# Patient Record
Sex: Female | Born: 1950 | Race: White | Hispanic: No
Health system: Midwestern US, Community
[De-identification: ages and names within clinical notes are randomized; demographics above are authoritative.]

## PROBLEM LIST (undated history)

## (undated) DIAGNOSIS — Z1231 Encounter for screening mammogram for malignant neoplasm of breast: Secondary | ICD-10-CM

## (undated) DIAGNOSIS — M109 Gout, unspecified: Secondary | ICD-10-CM

## (undated) DIAGNOSIS — I10 Essential (primary) hypertension: Secondary | ICD-10-CM

## (undated) DIAGNOSIS — G8929 Other chronic pain: Secondary | ICD-10-CM

## (undated) DIAGNOSIS — M545 Low back pain, unspecified: Principal | ICD-10-CM

## (undated) DIAGNOSIS — Z87891 Personal history of nicotine dependence: Secondary | ICD-10-CM

## (undated) DIAGNOSIS — F331 Major depressive disorder, recurrent, moderate: Principal | ICD-10-CM

## (undated) DIAGNOSIS — R2243 Localized swelling, mass and lump, lower limb, bilateral: Secondary | ICD-10-CM

## (undated) DIAGNOSIS — R52 Pain, unspecified: Secondary | ICD-10-CM

## (undated) DIAGNOSIS — J449 Chronic obstructive pulmonary disease, unspecified: Secondary | ICD-10-CM

## (undated) DIAGNOSIS — E559 Vitamin D deficiency, unspecified: Principal | ICD-10-CM

## (undated) DIAGNOSIS — Z9109 Other allergy status, other than to drugs and biological substances: Principal | ICD-10-CM

## (undated) DIAGNOSIS — M810 Age-related osteoporosis without current pathological fracture: Secondary | ICD-10-CM

## (undated) DIAGNOSIS — R2240 Localized swelling, mass and lump, unspecified lower limb: Secondary | ICD-10-CM

---

## 2011-03-04 LAB — CBC WITH AUTO DIFFERENTIAL
Basophils %: 0.8 % (ref 0–1)
Basophils Absolute: 0 10*3/uL (ref 0.0–0.1)
Eosinophils %: 2 % (ref 0–7)
Eosinophils Absolute: 0.1 10*3/uL (ref 0.0–0.6)
Hematocrit: 41.7 % (ref 37.0–47.0)
Hemoglobin: 14 GM/DL (ref 12.0–16.0)
Lymphocytes %: 29.6 % (ref 16–42)
Lymphocytes Absolute: 1.6 10*3/uL (ref 0.6–4.3)
MCH: 29.7 PG (ref 27–31)
MCHC: 33.6 % (ref 32.0–36.0)
MCV: 88.3 FL (ref 80–100)
MPV: 7.8 FL (ref 7.5–11.1)
Monocytes %: 6.1 % (ref 4–12)
Monocytes Absolute: 0.3 10*3/uL (ref 0.2–1.1)
Platelets: 214 10*3/uL (ref 150–400)
RBC: 4.72 10*6/uL (ref 4.20–5.40)
RDW: 14.6 % (ref 11.7–14.9)
Segs Absolute: 3.4 10*3/uL (ref 1.5–7.0)
Segs Relative: 61.5 % (ref 44–76)
WBC: 5.5 10*3/uL (ref 4.8–10.8)

## 2011-03-04 LAB — COMPREHENSIVE METABOLIC PANEL
ALT: 19 U/L (ref 7–35)
AST: 16 U/L (ref 8–33)
Albumin,Serum: 4.4 G/DL (ref 3.5–5.0)
Alkaline Phosphatase: 71 U/L (ref 25–100)
BUN: 18 mg/dL (ref 6–23)
Bilirubin, Total: 0.2 mg/dL — ABNORMAL LOW (ref 0.3–1.2)
CO2: 27 mMol/L (ref 20–32)
Calcium: 9.2 mg/dL (ref 8.3–10.6)
Chloride: 109 mMol/L (ref 99–109)
Creatinine: 0.7 mg/dL (ref 0.6–1.1)
GFR African American: >60 mL/min/{1.73_m2}
GFR Non-African American: >60 mL/min/{1.73_m2}
Glucose, GTT - Fasting: 90 MG/DL (ref 70–99)
Potassium: 4.6 MMOL/L (ref 3.5–5.1)
Sodium: 143 mmol/L (ref 136–145)
Total Protein: 7.1 g/dL (ref 6.4–8.3)

## 2011-03-04 LAB — SEDIMENTATION RATE, MANUAL: Sed Rate: 7 MM/HR (ref 0–30)

## 2011-03-05 LAB — CULTURE, URINE: Culture: NO GROWTH

## 2015-05-23 ENCOUNTER — Encounter: Attending: Gastroenterology | Primary: Family Medicine

## 2015-06-19 NOTE — Anesthesia Pre-Procedure Evaluation (Signed)
Department of Anesthesiology  Preprocedure Note       Name:  Sue Barber   Age:  64 y.o.  DOB:  [birth year]                                          MRN:  [id number]         Date:  06/19/2015      Surgeon:    Procedure:    Medications prior to admission:   Prior to Admission medications    Medication Sig Start Date End Date Taking? Authorizing Provider   celecoxib (CELEBREX) 200 MG capsule Take 200 mg by mouth 2 times daily.      Historical Provider, MD   gabapentin (NEURONTIN) 300 MG capsule Take 300 mg by mouth 3 times daily.      Historical Provider, MD   hydrocodone-acetaminophen (VICODIN) 5-500 MG per tablet Take 1 tablet by mouth every 6 hours as needed.      Historical Provider, MD   buPROPion (WELLBUTRIN XL) 150 MG XL tablet Take 150 mg by mouth every morning.      Historical Provider, MD   aripiprazole (ABILIFY) 2 MG tablet Take 2 mg by mouth daily.      Historical Provider, MD   UNABLE TO FIND Patient medications  lotrel 5-20 1 a day,  alerge 10mg  1 a day    Unable to find on database     Historical Provider, MD       Current medications:    Current Outpatient Prescriptions   Medication Sig Dispense Refill   ??? celecoxib (CELEBREX) 200 MG capsule Take 200 mg by mouth 2 times daily.       ??? gabapentin (NEURONTIN) 300 MG capsule Take 300 mg by mouth 3 times daily.       ??? hydrocodone-acetaminophen (VICODIN) 5-500 MG per tablet Take 1 tablet by mouth every 6 hours as needed.       ??? buPROPion (WELLBUTRIN XL) 150 MG XL tablet Take 150 mg by mouth every morning.       ??? aripiprazole (ABILIFY) 2 MG tablet Take 2 mg by mouth daily.       ??? UNABLE TO FIND Patient medications  lotrel 5-20 1 a day,  alerge 10mg  1 a day    Unable to find on database        No current facility-administered medications for this encounter.        Allergies:  No Known Allergies    Problem List:  There is no problem list on file for this patient.      Past Medical History:  No past medical history on file.    Past Surgical History:  No  past surgical history on file.    Social History:    Social History   Substance Use Topics   ??? Smoking status: Not on file   ??? Smokeless tobacco: Not on file   ??? Alcohol use Not on file                                Counseling given: Not Answered      Vital Signs (Current): There were no vitals filed for this visit.  BP Readings from Last 3 Encounters:   No data found for BP       NPO Status:                                                                                 BMI:   Wt Readings from Last 3 Encounters:   No data found for Wt     There is no height or weight on file to calculate BMI.    Anesthesia Evaluation  Patient summary reviewed  Airway:         Dental:          Pulmonary:negative ROS          Cardiovascular:                Beta Blocker:  Not on Beta Blocker   Neuro/Psych:   {neg ROS     GI/Hepatic/Renal:           Endo/Other:          Abdominal:                    Anesthesia Plan    ASA 2     MAC   ( Pre Anesthesia Assessment complete. Chart reviewed on 06/19/2015)            Konrad Felix, CRNA   06/19/2015

## 2015-06-20 ENCOUNTER — Encounter: Attending: Gastroenterology | Primary: Family Medicine

## 2015-06-20 LAB — CBC WITH AUTO DIFFERENTIAL
Basophils %: 0.8 % (ref 0–1)
Basophils Absolute: 0.1 10*3/uL
Eosinophils %: 1.5 % (ref 0–3)
Eosinophils Absolute: 0.1 10*3/uL
Hematocrit: 47.7 % — ABNORMAL HIGH (ref 37–47)
Hemoglobin: 15.8 GM/DL (ref 12.5–16.0)
Lymphocytes %: 20.8 % — ABNORMAL LOW (ref 24–44)
Lymphocytes Absolute: 1.4 10*3/uL
MCH: 28.7 PG (ref 27–31)
MCHC: 33.1 % (ref 32.0–36.0)
MCV: 86.7 FL (ref 78–100)
MPV: 8.4 FL (ref 7.5–11.1)
Monocytes %: 6.5 % — ABNORMAL HIGH (ref 0–4)
Monocytes Absolute: 0.4 10*3/uL
Platelets: 207 10*3/uL (ref 140–440)
RBC: 5.5 10*6/uL — ABNORMAL HIGH (ref 4.2–5.4)
RDW: 14.4 % (ref 11.7–14.9)
Segs Absolute: 4.5 10*3/uL
Segs Relative: 70.4 % — ABNORMAL HIGH (ref 36–66)
WBC: 6.5 10*3/uL (ref 4.0–10.5)

## 2015-06-20 LAB — BASIC METABOLIC PANEL
Anion Gap: 11 (ref 4–16)
BUN: 11 MG/DL (ref 6–23)
CO2: 27 MMOL/L (ref 21–32)
Calcium: 9.1 MG/DL (ref 8.3–10.6)
Chloride: 103 mMol/L (ref 99–110)
Creatinine: 0.9 MG/DL (ref 0.6–1.1)
GFR African American: >60 mL/min/{1.73_m2}
GFR Non-African American: >60 mL/min/{1.73_m2}
Glucose: 96 MG/DL (ref 70–140)
Potassium: 4.1 MMOL/L (ref 3.5–5.1)
Sodium: 141 MMOL/L (ref 135–145)

## 2015-06-20 MED ORDER — PROPOFOL 200 MG/20ML IV EMUL
200 MG/20ML | INTRAVENOUS | Status: AC
Start: 2015-06-20 — End: ?

## 2015-06-20 MED ORDER — LACTATED RINGERS IV SOLN
INTRAVENOUS | Status: AC
Start: 2015-06-20 — End: 2015-06-20
  Administered 2015-06-20: 12:00:00 via INTRAVENOUS

## 2015-06-20 MED ORDER — LACTATED RINGERS IV SOLN
INTRAVENOUS | Status: DC
Start: 2015-06-20 — End: 2015-06-20

## 2015-06-20 MED ORDER — ONDANSETRON HCL 4 MG/2ML IJ SOLN
4 MG/2ML | INTRAMUSCULAR | Status: AC
Start: 2015-06-20 — End: ?

## 2015-06-20 MED FILL — PROPOFOL 200 MG/20ML IV EMUL: 200 MG/20ML | INTRAVENOUS | Qty: 20

## 2015-06-20 MED FILL — ONDANSETRON HCL 4 MG/2ML IJ SOLN: 4 MG/2ML | INTRAMUSCULAR | Qty: 2

## 2015-06-20 MED FILL — LACTATED RINGERS IV SOLN: INTRAVENOUS | Qty: 1000

## 2015-06-20 NOTE — H&P (Signed)
[facility]   Pre-operative History and Physical    Patient: Sue Barber  DOB: 07-28-1951      History Obtained From:  patient        HISTORY OF PRESENT ILLNESS:                The patient is a 64 y.o. female with significant past medical history as below who presents for C scope    Indication Colon cancer screening    Past Medical History:        Diagnosis Date   ??? Arthritis      "Back, Knees"   ??? Depression    ??? Diverticulosis    ??? Hyperlipidemia      "Borderline"   ??? Hypertension    ??? Restless legs    ??? Seizures (HCC)      "Had Febrile Seizures Until I Was One Year Old"   ??? Shortness of breath on exertion    ??? Sleep apnea      Uses CPAP   ??? Wears glasses        Past Surgical History:        Procedure Laterality Date   ??? Colonoscopy  2001     "Found 2 Polyps"   ??? Tubal ligation  1978   ??? Cholecystectomy  1999     Appendectomy   ??? Appendectomy  1999     Deone During Cholecystectomy   ??? 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 Joint replacement Left 02/2010     Total Left Knee         Current Medications:    Medications    Scheduled Medications:    PRN Medications:       Allergies:  Other    Social History:   TOBACCO:   reports that she has been smoking.  She started smoking about 52 years ago. She has a 13.00 pack-year smoking history. She has never used smokeless tobacco.  ETOH:   reports that she drinks alcohol.    Family History:       Problem Relation Age of Onset   ??? Heart Disease Mother    ??? High Blood Pressure Mother    ??? Stroke Mother    ??? Heart Disease Father    ??? High Blood Pressure Father    ??? Other Sister      "Iron Deficiency"   ??? Other Brother      Iron Deficiency   ??? Diabetes Brother    ??? Heart Disease Brother      "Bypass Surgery"   ??? Arthritis Sister    ??? Psoriasis Sister    ??? Other Brother      "Hepatitis C"   ??? Other Son      Sleep Apnea   ??? Mental Illness Son      PTSD   ??? Psoriasis Son        REVIEW OF SYSTEMS:    Negative except for HPI        PHYSICAL EXAM:       Vitals:    Visit Vitals   ??? Pulse 81   ??? Temp 97.8 ??F (36.6 ??C) (Temporal)   ??? Resp 16   ??? Ht 4' 9.5" (1.461 m)   ??? Wt 270 lb (122.5 kg)   ??? SpO2 95%   ??? Breastfeeding No   ??? BMI 57.42 kg/m2       General Appearance:  Alert, cooperative, no distress, appears stated age   HEENT:    NO anemia, No jaundice , no Cyanosis, Supple neck   Neck:   Supple, symmetrical, trachea midline, no adenopathy;     thyroid:    Lungs:     Clear to auscultation bilaterally, respirations unlabored   Chest Wall:    No tenderness or deformity    Heart:    Regular rate and rhythm, S1 and S2 normal, no murmur, rub   or gallop   Abdomen:     Soft, non-tender, bowel sounds active all four quadrants,     no masses, no organomegaly, no ascitis   Rectal:    Planned at time of C scope   Extremities:   Extremities normal, atraumatic, no cyanosis or edema   Pulses:   2+ and symmetric all extremities   Skin:   Skin color, texture, turgor normal, no rashes or lesions   Lymph nodes:   No abnormality   Neurologic:   No focal deficits, moving all four extremities      ASA Grade:  ASA 2 - Patient with mild systemic disease with no functional limitations  Air Way:    Prior Anesthesia complication: NILL    ASSESSMENT AND PLAN:    1.  Patient is a 64 y.o. female here for colonoscopy with deep sedation  2.  Procedure options, risks and benefits reviewed with patient.  Patient expresses understanding.    Tilak Oakley  06/20/2015  9:10 AM

## 2015-06-20 NOTE — Anesthesia Pre-Procedure Evaluation (Signed)
Department of Anesthesiology  Preprocedure Note       Name:  Sue Barber   Age:  64 y.o.  DOB:  [birth year]                                          MRN:  [id number]         Date:  06/20/2015      Surgeon:    Procedure:colonoscopy    Medications prior to admission:   Prior to Admission medications    Medication Sig Start Date End Date Taking? Authorizing Provider   buPROPion (WELLBUTRIN XL) 300 MG XL tablet Take 300 mg by mouth every morning   Yes Historical Provider, MD   amLODIPine-benazepril (LOTREL) 5-20 MG per capsule Take 1 capsule by mouth every morning   Yes Historical Provider, MD   loratadine (CLARITIN) 10 MG tablet Take 10 mg by mouth every morning Over The Counter   Yes Historical Provider, MD   buPROPion (WELLBUTRIN XL) 150 MG XL tablet Take 150 mg by mouth every morning.     Yes Historical Provider, MD   celecoxib (CELEBREX) 200 MG capsule Take 200 mg by mouth 2 times daily.      Historical Provider, MD       Current medications:    Current Facility-Administered Medications   Medication Dose Route Frequency Provider Last Rate Last Dose   ??? lactated ringers infusion   Intravenous Continuous Charisse Klinefelter, DO 100 mL/hr at 06/20/15 0820         Allergies:    Allergies   Allergen Reactions   ??? Other Rash     "Allergic To Certain Scents In Body Lotions Causing Rash"       Problem List:  There is no problem list on file for this patient.      Past Medical History:        Diagnosis Date   ??? Arthritis      "Back, Knees"   ??? Depression    ??? Diverticulosis    ??? Hyperlipidemia      "Borderline"   ??? Hypertension    ??? Restless legs    ??? Seizures (HCC)      "Had Febrile Seizures Until I Was One Year Old"   ??? Shortness of breath on exertion    ??? Sleep apnea      Uses CPAP   ??? Wears glasses        Past Surgical History:        Procedure Laterality Date   ??? Colonoscopy  2001     "Found 2 Polyps"   ??? Tubal ligation  1978   ??? Cholecystectomy  1999     Appendectomy   ??? Appendectomy  1999     Deone During  Cholecystectomy   ??? 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 Joint replacement Left 02/2010     Total Left Knee       Social History:    Social History   Substance Use Topics   ??? Smoking status: Current Every Day Smoker     Packs/day: 0.25     Years: 52.00     Start date: 06/19/1963   ??? Smokeless tobacco: Never Used   ??? Alcohol use Yes      Comment: "Occ. Maybe Once A Month"  Ready to quit: Yes  Counseling given: No      Vital Signs (Current):   Vitals:    06/19/15 1216 06/20/15 0808   Pulse:  81   Resp:  16   Temp:  97.8 ??F (36.6 ??C)   TempSrc:  Temporal   SpO2:  95%   Weight: 270 lb (122.5 kg)    Height: 4' 9.5" (1.461 m)                                               BP Readings from Last 3 Encounters:   No data found for BP       NPO Status: Time of last liquid consumption: 0145                        Time of last solid consumption: 2000                        Date of last liquid consumption: 06/20/15                        Date of last solid food consumption: 06/18/15    BMI:   Wt Readings from Last 3 Encounters:   06/19/15 270 lb (122.5 kg)     Body mass index is 57.42 kg/(m^2).    Anesthesia Evaluation  Patient summary reviewed no history of anesthetic complications:   Airway: Mallampati: I  TM distance: >3 FB   Neck ROM: full  Mouth opening: > = 3 FB Dental: normal exam         Pulmonary:negative ROS and normal exam    (+) sleep apnea: on CPAP,         Cardiovascular:  Exercise tolerance: poor (<4 METS),   (+) hypertension:, DOE: after ambulating 1 flight of stairs,         Rhythm: regular  Rate: normal              Beta Blocker:  Not on Beta Blocker   Neuro/Psych:   (+) seizures: well controlled,       Comments: 60 years ago  Restless legs GI/Hepatic/Renal:   (+) bowel prep        Endo/Other: negative ROS         Abdominal:   (+) obese,                  Anesthesia Plan    ASA 3     general and TIVA   ( Pre Anesthesia Assessment complete. Chart reviewed on  06/19/2015)  intravenous induction   Anesthetic plan and risks discussed with patient.    Pre Anesthesia Evaluation complete. Anesthesia plan, risks, benefits, alternatives, and personnel discussed with patient and/or legal guardian. Patient and/or legal guardian verbalized an understanding and agreed to proceed. Anesthesia plan discussed with care team members and agreed upon.  Fayne Norrie, CRNA  06/20/2015        Fayne Norrie, CRNA   06/20/2015

## 2015-06-20 NOTE — Discharge Instructions (Signed)
[facility]  [phone]    Do not drive, work around machines or use equipment.  Do not drink any alcoholic beverages.  Do not smoke while alone.  Avoid making important decisions.  Plan to spend a quiet, relaxed evening @ home.  Resume normal activities as you begin to feel better.  Eat lightly for your first meal, then gradually increase your diet to what is normal for you.  In case of nausea, avoid food and drink only clear liquids.  Resume food as nausea ceases.  Notify your surgeon if you experience fever, chills, large amount of bleeding, difficulty breathing, persistent nausea and vomiting or any other disturbing problem.  Call for a follow-up appointment with your surgeon.

## 2015-06-20 NOTE — Progress Notes (Signed)
I have examined the patient within 24 hours before the procedure and there is no change in the history and physical exam  recorded by me previously.    I have reviewed with the patient and/or family the risks, benefits, and alternatives to the procedure.    Rosezetta Schlatter, MD  [facility] gastroenterology  06/20/2015  8:47 AM

## 2015-06-20 NOTE — Progress Notes (Addendum)
0955 Pt given coffee and water. Pt denies c/o. Call light in reach. 1045 Pt up to restroom with assist. Pt voided without difficulty and tolerated well. Pt back to room. IV removed and pt ready to get dressed to go home to go home. Sister at bedside. Call light in reach.

## 2015-06-20 NOTE — Anesthesia Post-Procedure Evaluation (Signed)
Anesthesia Post-op Note    Patient: Sue Barber  MRN: [id number]  Birthdate: Sep 07, 1951  Date of evaluation: 06/20/2015  Time:  9:49 AM     Procedure(s) Performed: colonoscopy    Last Vitals:   Visit Vitals   ??? Pulse 81   ??? Temp 97.8 ??F (36.6 ??C) (Temporal)   ??? Resp 16   ??? Ht 4' 9.5" (1.461 m)   ??? Wt 270 lb (122.5 kg)   ??? SpO2 95%   ??? Breastfeeding No   ??? BMI 57.42 kg/m2       Aldrete Phase I:   10    Aldrete Phase II:  10    Anesthesia Post Evaluation    Final anesthesia type: general and TIVA  Patient location during evaluation: procedure area  Patient participation: complete - patient participated  Level of consciousness: awake and alert  Pain score: 0  Airway patency: patent  Nausea & Vomiting: no nausea and no vomiting  Complications: no  Cardiovascular status: hemodynamically stable  Respiratory status: acceptable, spontaneous ventilation, room air and nonlabored ventilation  Hydration status: euvolemic        Fayne Norrie, CRNA  9:49 AM

## 2015-06-20 NOTE — Other (Signed)
Patient Acct Nbr:  [id number]  Primary AUTH/CERT:    Primary Insurance Company Name:   [employer]  Primary Insurance Plan Name:  Rachael Fee ACCESS/FEDERAL(R)  Primary Insurance Group Number:  [id number]  Primary Insurance Plan Type: B  Primary Insurance Policy Number:  [id number]

## 2015-11-19 ENCOUNTER — Encounter

## 2015-11-19 ENCOUNTER — Inpatient Hospital Stay: Admit: 2015-11-19 | Attending: Family Medicine | Primary: Family Medicine

## 2015-11-19 DIAGNOSIS — Z1231 Encounter for screening mammogram for malignant neoplasm of breast: Secondary | ICD-10-CM

## 2015-11-19 NOTE — Telephone Encounter (Signed)
lmovm to call and set up cardiolite per dr Andrey Campanilewilson

## 2015-11-19 NOTE — Telephone Encounter (Signed)
Patient called to schedule this, Sue Barber not available but patient said she would call office to schedule after her dentist appointment today.

## 2015-11-27 ENCOUNTER — Ambulatory Visit: Admit: 2015-11-27 | Discharge: 2015-11-27 | Payer: BLUE CROSS/BLUE SHIELD | Primary: Family Medicine

## 2015-11-27 DIAGNOSIS — I208 Other forms of angina pectoris: Secondary | ICD-10-CM

## 2015-11-27 NOTE — Progress Notes (Signed)
9:18 AM    11/27/15    Site: hand left, condition patent and no redness    # of attempts: 1

## 2015-11-28 NOTE — Telephone Encounter (Signed)
Left message to advise of normal NM.

## 2017-01-26 ENCOUNTER — Ambulatory Visit
Admit: 2017-01-26 | Discharge: 2017-01-26 | Payer: BLUE CROSS/BLUE SHIELD | Attending: Surgery | Primary: Family Medicine

## 2017-01-26 DIAGNOSIS — M79604 Pain in right leg: Secondary | ICD-10-CM

## 2017-01-26 MED ORDER — GABAPENTIN 100 MG PO CAPS
100 MG | ORAL_CAPSULE | Freq: Three times a day (TID) | ORAL | 0 refills | Status: DC
Start: 2017-01-26 — End: 2017-04-21

## 2017-02-09 ENCOUNTER — Ambulatory Visit
Admit: 2017-02-09 | Discharge: 2017-02-09 | Payer: BLUE CROSS/BLUE SHIELD | Attending: Surgery | Primary: Family Medicine

## 2017-02-09 ENCOUNTER — Ambulatory Visit: Admit: 2017-02-09 | Discharge: 2017-02-09 | Payer: BLUE CROSS/BLUE SHIELD | Primary: Family Medicine

## 2017-02-09 DIAGNOSIS — I83893 Varicose veins of bilateral lower extremities with other complications: Secondary | ICD-10-CM

## 2017-02-09 DIAGNOSIS — M79604 Pain in right leg: Secondary | ICD-10-CM

## 2017-02-09 NOTE — Progress Notes (Signed)
Neg DVT. See report.

## 2017-02-19 ENCOUNTER — Encounter: Attending: Surgery | Primary: Family Medicine

## 2017-03-05 ENCOUNTER — Ambulatory Visit
Admit: 2017-03-05 | Discharge: 2017-03-05 | Payer: BLUE CROSS/BLUE SHIELD | Attending: Surgery | Primary: Family Medicine

## 2017-03-05 DIAGNOSIS — I83891 Varicose veins of right lower extremities with other complications: Secondary | ICD-10-CM

## 2017-03-05 NOTE — Patient Instructions (Signed)
Richard M. Nedelman, M.D., F.A.C.S.  Jennifer M. Daniels, M.D., F.A.C.S.  Steven E. Conkel, M.D.,F.A.C.S.    [address], [address], [place] [address]    Phone [phone]  Fax [phone]  [email or web address]     Post-Op Instructions:  1.  Normal daily activity, including stairs is okay following your closure.  2.  No heavy lifting, pushing or pulling for 24 hours.  3.  You may return to work the following day.  4.  You will remove the co-band wrap and stocking 2 days after surgery. When removing the co-band wrap have legs elevated and rest for 5 minutes before walking to the shower.  Do not reapply the co-band wrap.  5.  Return to the office 3-5 days after surgery for a post-op ultrasound.  6.  For pain management, you may take Tylenol, Advil or Motrin as directed.    Normal Post-Operative Changes:  Swelling:  It is common to have swelling and some bruising around the bandage (at the ankle and thigh).  This is normal due to the compression of the bandage pushing the fluid towards the foot (similar to wearing tight shoes).  Bruising:  You may notice some bruising after the removal of your bandage.  This is normal and can occur along the track of the treated vein, ares of injections and incisions. This will improve gradually after surgery.  Numbness:  If numbness occurs, it is caused by the irritation to the nerve that runs alongside of the treated vein.  This will resolve slowly.  You should note improvement each week.  Massaging along the track of the treated vein 3-4 times a day can help with the numbness.  Pain:  Rarely, patients experience discomfort and redness along the path of the treated vein approximately one week after your procedure.  This is the inflammatory reaction within your body due to the vein closure.  Stretching 3-4 times a day can help with the discomfort.    Summary:  1.  Occasionally, you may actually be able to feel the treated vein that has been occluded.  This will  feel like a cord under your skin.  Massaging the area vigorously will help and this will improve slowly after surgery.  2.  Noticing more aching and even swelling a week after surgery is normal.  This results from removal of the compression bandage and being more active.  3.  Compression stockings may be worn at any time after removal of the dressings.  However, if the stockings irritate the incision areas, its okay to wait to wear them after the incisions are healed.  They are especially helpful when flying and/or for long times.  4.  Periodic elevation of the legs for about 5-10 minutes during the first month after your surgery will decrease any swelling and/or aching.

## 2017-03-08 ENCOUNTER — Ambulatory Visit: Admit: 2017-03-08 | Discharge: 2017-03-08 | Payer: BLUE CROSS/BLUE SHIELD | Primary: Family Medicine

## 2017-03-08 DIAGNOSIS — I83891 Varicose veins of right lower extremities with other complications: Secondary | ICD-10-CM

## 2017-03-08 NOTE — Progress Notes (Signed)
Neg DVT. See report.

## 2017-03-08 NOTE — Progress Notes (Signed)
Please refer to the media tab for today's operative note.

## 2017-03-14 NOTE — Progress Notes (Signed)
Date of Consultation:  01/26/2017    PCP:  Wilford Corner, MD    Referring Provider:  same     Chief Complaint: leg pain    History of Present Illness:   Sue Barber is a 66 y.o. female who presents with a complaint of severe leg pain. She is in tears in the office today, she can't sit till. She is pacing then sitting then pacing again. She states she has had restless leg for years. The last three weeks her leg pain has gotten more severe with sharp shooting pain right > left and leg cramps. She states she hasn't been able to work the last few days because of the pain. She works as a Haematologist and has had to sit to work over the last month but the last few days she couldn't even tolerate that. She has been taking vicodin and naprosyn the last several days without any relief. She states she can't stand in 1 place nor sit for periods of time without severe pain. She states even driving is difficult now because of the pain.      Past Medical History:  Past Medical History:   Diagnosis Date   . Arthritis     "Back, Knees"   . Depression    . Diverticulosis    . History of nuclear stress test 11/27/2015    cardiolite-normal,EF70%   . Hyperlipidemia     "Borderline"   . Hypertension    . Restless legs    . Seizures (HCC)     "Had Febrile Seizures Until I Was [age] Year Old"   . Shortness of breath on exertion    . Sleep apnea     Uses CPAP   . Wears glasses        Past Surgical History:  Past Surgical History:   Procedure Laterality Date   . APPENDECTOMY  1999    Deone During Cholecystectomy   . CHOLECYSTECTOMY  1999    Appendectomy   . COLONOSCOPY  2001    "Found 2 Polyps"   . 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   . JOINT REPLACEMENT Left 02/2010    Total Left Knee   . TUBAL LIGATION  1978       Home Medications:   Prior to Admission medications    Medication Sig Start Date End Date Taking? Authorizing Provider   HYDROcodone-acetaminophen (NORCO) 5-325 MG per tablet  01/20/17  Yes Historical Provider, MD    calcipotriene (DOVONEX) 0.005 % cream  11/27/16  Yes Historical Provider, MD   gabapentin (NEURONTIN) 100 MG capsule Take 1 capsule by mouth 3 times daily for 90 days. 01/26/17 04/26/17 Yes Ander Slade, MD   buPROPion (WELLBUTRIN XL) 300 MG XL tablet Take 300 mg by mouth every morning   Yes Historical Provider, MD   amLODIPine-benazepril (LOTREL) 5-20 MG per capsule Take 1 capsule by mouth every morning   Yes Historical Provider, MD   loratadine (CLARITIN) 10 MG tablet Take 10 mg by mouth every morning Over The Counter   Yes Historical Provider, MD   celecoxib (CELEBREX) 200 MG capsule Take 200 mg by mouth 2 times daily.     Yes Historical Provider, MD   buPROPion (WELLBUTRIN XL) 150 MG XL tablet Take 150 mg by mouth every morning.     Yes Historical Provider, MD        Allergies:  Other     Social History:  Social History   Substance Use Topics   . Smoking status: Current Every Day Smoker     Packs/day: 0.25     Years: 52.00     Start date: 06/19/1963   . Smokeless tobacco: Never Used   . Alcohol use Yes      Comment: "Occ. Maybe Once A Month"       Family History:  Mother had a CVA    Physical Examination:    BP 136/74   Ht 4' 9.5" (1.461 m)   Wt 282 lb (127.9 kg)   BMI 59.97 kg/m        General appearance: Awake, alert, oriented to person, place and time  Pulses:    PT   DP  Right:  Doppler strong Doppler strong   Left  Doppler strong Doppler strong      Right Leg:   Signs of venous disease:  present   Spider Veins:   present   Visible varicose veins:  minimal   Edema:   present, 2+   Pigmentation/Dermatitis: Present, redness below knee   Healed Ulcers:   absent   Active Ulcers:   absent    Left Leg:    Signs of venous disease:  present   Spider Veins:   present   Visible varicose veins:  minimal   Edema:   present, 2+   Pigmentation/Dermatitis: Present, redness below knee   Healed Ulcers:   absent   Active Ulcers:   absent    MEDICAL DECISION MAKING/TESTING   Duplex  Ultrasound:  bilateral   Sclerotherapy:   neither   Medical Compression Stockings: bilateral    Assessment and Plan:   Patient has signs and symptoms consistent with venous insufficiency however her severity of pain is not consistent with vein disease. I reviewed the intra-office literature about VNUS Closure and the anatomy, disease process, diagnosis, symptoms and treatments available. I specifically discussed the risks of VNUS ablation including the risk of blood clot, localized pain/swelling, failure of treatment and persistent or worsening of symptoms or discoloration of the skin. She stated an understanding and wants to proceed with ultrasound evaluation. I did discuss with her her expectations of relief. I discussed venous insufficiency can cause cramping and pain but her described pain is out of proportion for venous disease. She asked for more pain medication and I told her I am not prescribing narcotics for her pain as I cannot attribute it all to vein disease. I suggested neurontin and I explained it would need to build in her system and was not an instant fix for her pain. Again she was tearful and quite upset. I discussed possible nerve compression as the cause of her pain and why I thought neurontin may help. I suggested elevated her legs and weight loss. The patient will follow-up with me after the ultrasound and we will discuss the treatment plan.     Please refer to the patient screening form included under the media section.

## 2017-04-06 ENCOUNTER — Ambulatory Visit: Admit: 2017-04-06 | Discharge: 2017-04-06 | Payer: BLUE CROSS/BLUE SHIELD | Primary: Family Medicine

## 2017-04-06 DIAGNOSIS — I83893 Varicose veins of bilateral lower extremities with other complications: Secondary | ICD-10-CM

## 2017-04-06 NOTE — Progress Notes (Signed)
Neg DVT. See report.

## 2017-04-15 ENCOUNTER — Encounter

## 2017-04-15 ENCOUNTER — Inpatient Hospital Stay: Admit: 2017-04-15 | Attending: Family Medicine | Primary: Family Medicine

## 2017-04-15 DIAGNOSIS — Z1231 Encounter for screening mammogram for malignant neoplasm of breast: Secondary | ICD-10-CM

## 2017-04-21 ENCOUNTER — Ambulatory Visit
Admit: 2017-04-21 | Discharge: 2017-04-21 | Payer: BLUE CROSS/BLUE SHIELD | Attending: Surgery | Primary: Family Medicine

## 2017-04-21 DIAGNOSIS — Z09 Encounter for follow-up examination after completed treatment for conditions other than malignant neoplasm: Secondary | ICD-10-CM

## 2017-05-15 NOTE — Progress Notes (Signed)
I reviewed the results of the bilateral ultrasound with the patient. She has positive reflux in the right Greater saphenous vein. While her symptoms are consistant with this finding, I do believe she has a neuropathic pain as well. Today in the office she is crying. She states she stopped going to work and is disabled because of her leg pain. Patient is crying stating she has to have the procedure on the left because that leg hurts as well. We discussed possibly repeating the ultrasound at the end of the day after she has been standing but at this time the right leg shows reflux that can be treated with ablation therapy. She has worn compression stocking to alleviate symptoms as well as leg elevation and anti-inflammatory medication for these symptoms. It affects the activities of daily life, work and daily activities at home. Currently she states she cannot work because of the pain. 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  I reviewed the procedures risks and benefits including but not limited to the risk of bleeding, risk of infection, risk of failure of treatment and risk of blood clot with the patient and answered all questions. I gave the patient the [facility] procedure pamphlet. The patient wishes to proceed with treatment.

## 2017-05-16 NOTE — Progress Notes (Signed)
Overall she feels much better.  Her neurontin has been increased to 1200 mg 3 times a day by Dr. Andrey CampanileWilson for neuropathy  Her ablation site is healing well  Her ultrasound shows the right leg GSV ablation was a success  She is very happy and feels much better  She will f/u prn

## 2018-04-18 ENCOUNTER — Ambulatory Visit: Primary: Family Medicine

## 2018-04-25 ENCOUNTER — Inpatient Hospital Stay: Admit: 2018-04-25 | Payer: BLUE CROSS/BLUE SHIELD | Primary: Family Medicine

## 2018-04-25 ENCOUNTER — Encounter

## 2018-04-25 DIAGNOSIS — Z1231 Encounter for screening mammogram for malignant neoplasm of breast: Secondary | ICD-10-CM

## 2021-05-11 IMAGING — MR MRI [HOSPITAL] PELVIS WO CONTRAST
6 series · 48 of 48 positions shown · non-contrast
Comparison: None.

INDICATION: Bilateral osteoarthritis of sacroiliac joints.
TECHNIQUE: Multiplanar, multiecho imaging of the pelvis was performed, including T1-weighted and fluid sensitive sequences.

[Series 2: t1_cor · coronal · 4.0mm · 0.91mm/px · 6 of 32 slices shown]
[im 1/32]
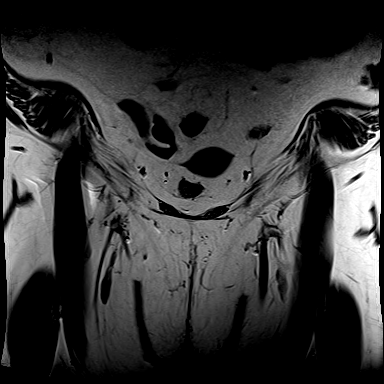
[im 7/32]
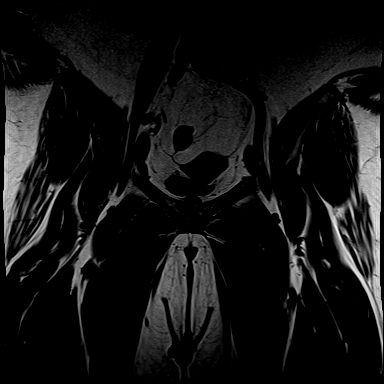
[im 13/32]
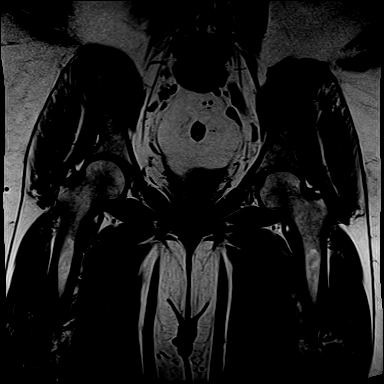
[im 19/32]
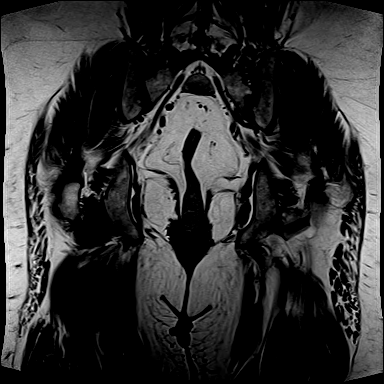
[im 25/32]
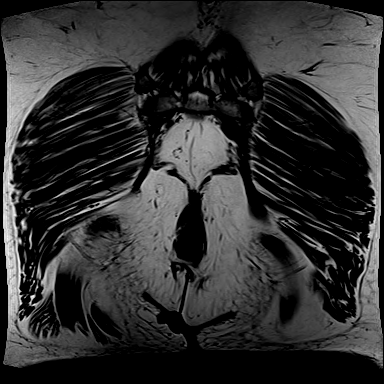
[im 32/32]
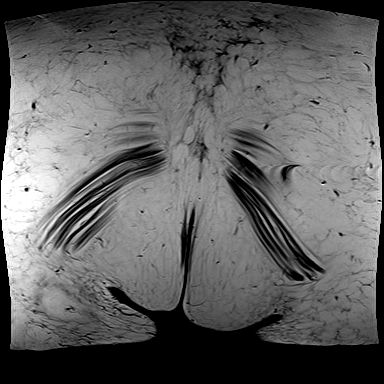

[Series 3: ir_cor · coronal · 4.0mm · 1.15mm/px · 6 of 32 slices shown]
[im 1/32]
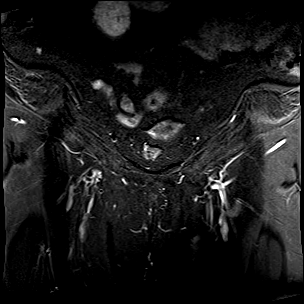
[im 7/32]
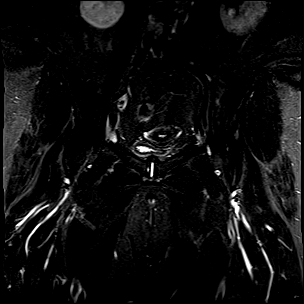
[im 13/32]
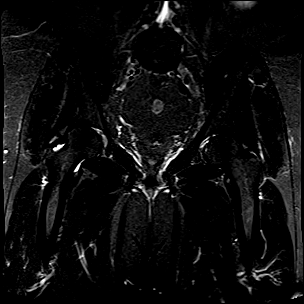
[im 19/32]
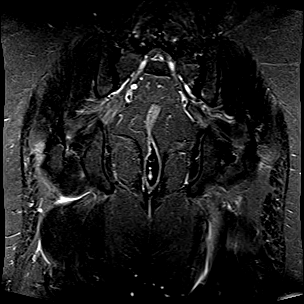
[im 25/32]
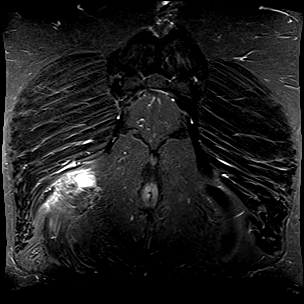
[im 32/32]
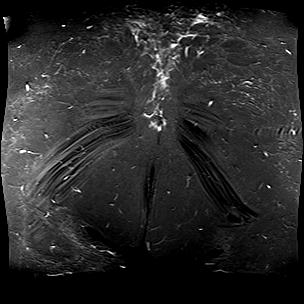

[Series 4: t1_sag · sagittal · 5.0mm · 0.91mm/px · 10 of 52 slices shown]
[im 1/52]
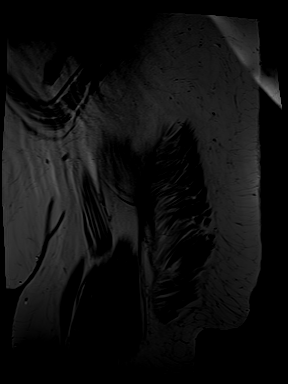
[im 6/52]
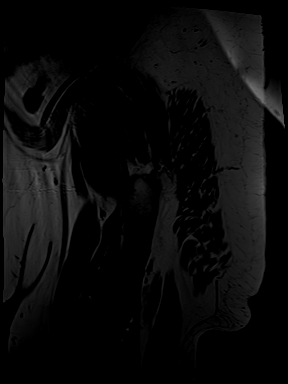
[im 12/52]
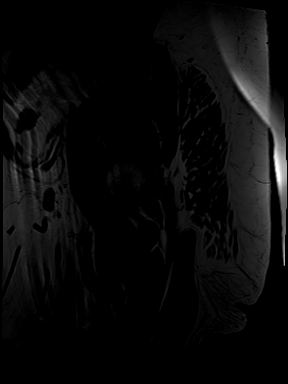
[im 18/52]
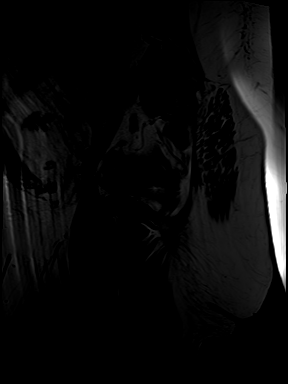
[im 23/52]
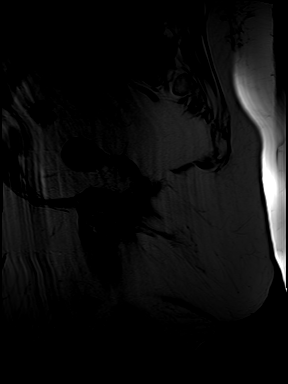
[im 29/52]
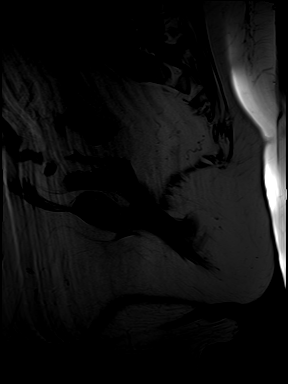
[im 35/52]
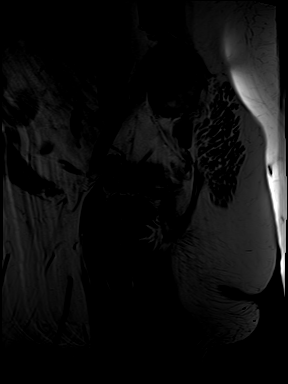
[im 40/52]
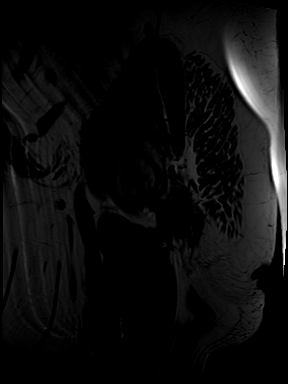
[im 46/52]
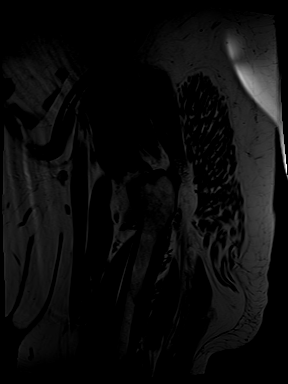
[im 52/52]
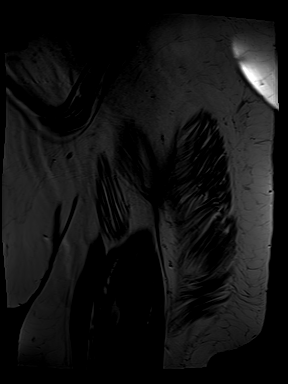

[Series 5: ir_sag · sagittal · 5.0mm · 1.15mm/px · 10 of 52 slices shown]
[im 1/52]
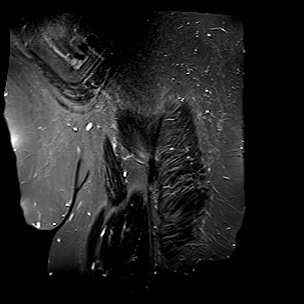
[im 6/52]
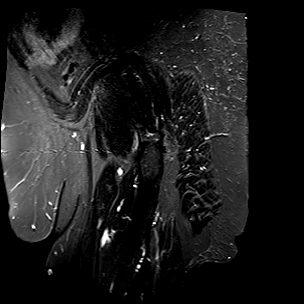
[im 12/52]
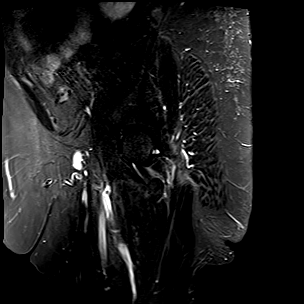
[im 18/52]
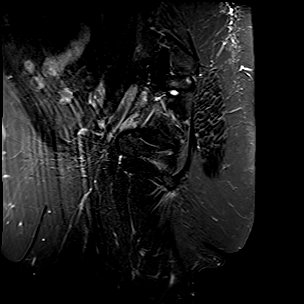
[im 23/52]
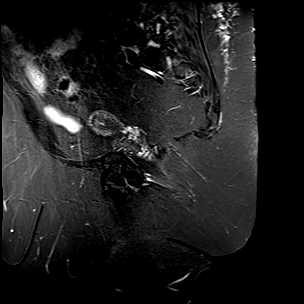
[im 29/52]
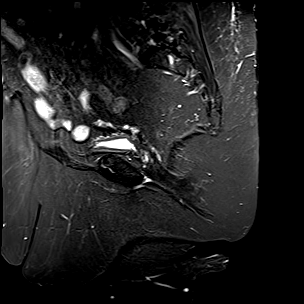
[im 35/52]
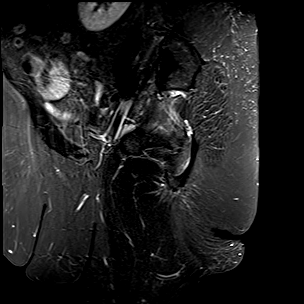
[im 40/52]
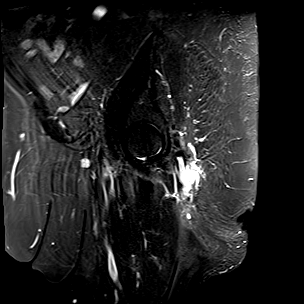
[im 46/52]
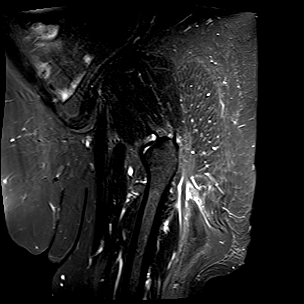
[im 52/52]
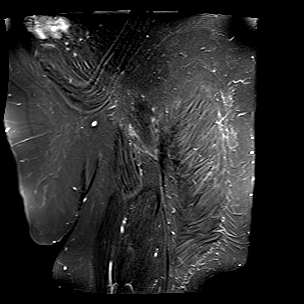

[Series 6: t1_axial · axial · 4.0mm · 1.19mm/px · z∈[-47,+167]mm · 8 of 44 slices shown]
[im 1/44]
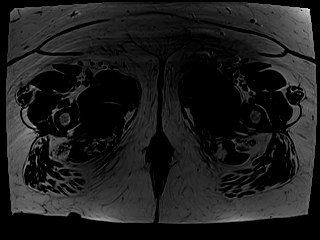
[im 7/44]
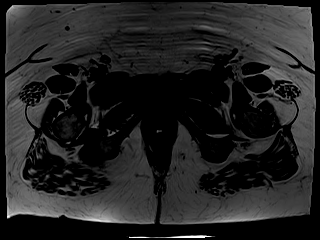
[im 13/44]
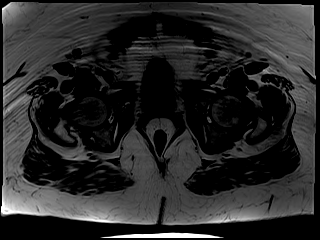
[im 19/44]
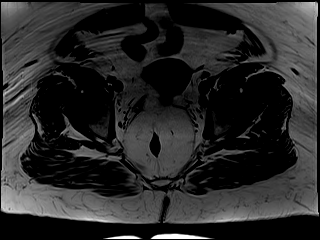
[im 25/44]
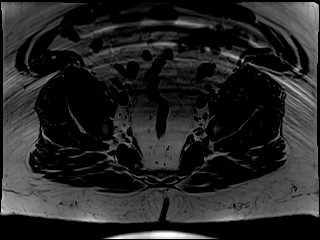
[im 31/44]
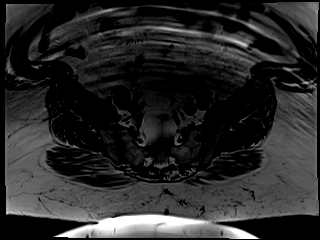
[im 37/44]
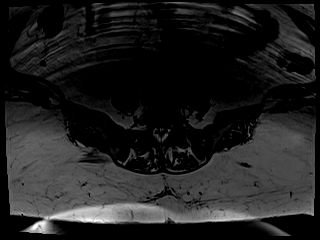
[im 44/44]
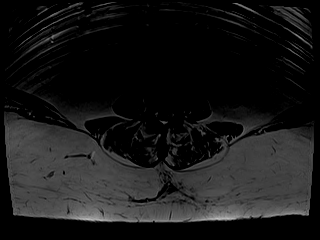

[Series 10: t2_axial_fs_r · axial · 4.0mm · 1.56mm/px · z∈[-53,+161]mm · 8 of 44 slices shown]
[im 1/44]
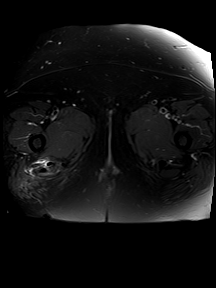
[im 7/44]
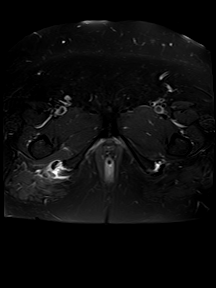
[im 13/44]
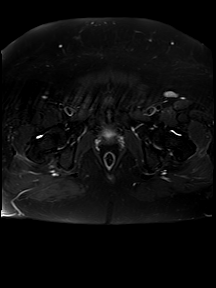
[im 19/44]
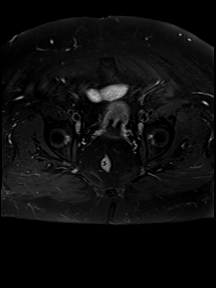
[im 25/44]
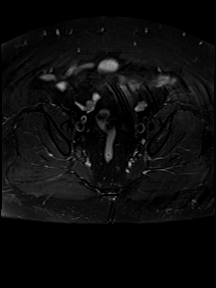
[im 31/44]
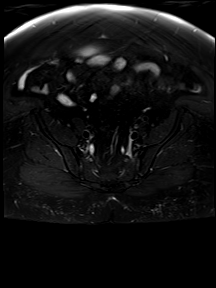
[im 37/44]
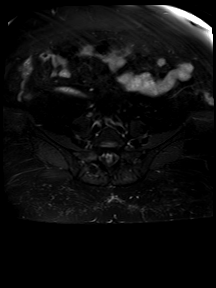
[im 44/44]
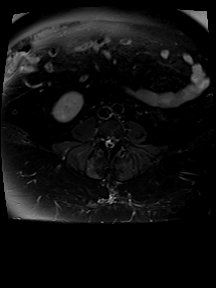

[48 of 48 positions shown; findings below may reference images not displayed]

FINDINGS: No acute fracture. No femoral head AVN. No erosion. No suspicious lytic or blastic osseous lesion. Moderate pubic symphysis DJD with small pubic symphysis joint effusion. Minimal DJD both SI joints with no ankylosis or erosion.

Acute full-thickness tear of the right hamstring tendon origin predominantly involving the conjoined biceps femoris/semitendinosis tendons. Approximately 3 cm of distal retraction. Small fluid/hematoma about the torn and retracted tendons. Chronic high-grade partial-thickness tearing of the left hamstring tendon origin. Gluteal tendons are intact. No trochanteric bursitis. Iliopsoas and rectus femoris tendons are intact. No hip joint effusion. Mild DJD of both hips.

No fluid collection. No soft tissue mass. No acute muscle injury. Mild atrophy of the right gluteus medius muscle.

Mild disc bulge and moderate facet arthrosis at L4-5 with moderate right and mild left foraminal stenosis. Minimal disc bulge and moderate facet arthrosis at L5-S1 and with moderate right foraminal stenosis. Epidural lipomatosis at the L4-S1 levels with significant thecal sac stenosis.

No free fluid in the pelvis. Significantly prominent perirectal fat with narrowing of the distal sigmoid colon and rectum. No inguinal adenopathy or hernia. Diastasis recti. Sigmoid diverticulosis. Focal atrophy of the right rectus abdominis muscles, suggesting old injury.
IMPRESSION: 1.
Acute full-thickness tear of the right hamstring tendon origin, predominantly involving the conjoined biceps femoris/semitendinosus tendons with 3 cm of distal retraction.

2.
Moderate pubic symphysis DJD with small joint effusion. Mild DJD of both hips.

3.
Epidural lipomatosis at the L4-S1 levels with significant thecal sac stenosis.

4.
Chronic high-grade partial-thickness tearing of the left hamstring tendon origin.

5.
Significantly prominent perirectal fat with narrowing of the distal sigmoid colon/rectum.

## 2021-05-29 IMAGING — MR MRI LSPINE WO CONTRAST
7 series · 32 of 48 positions shown · non-contrast
Comparison: None.

INDICATION: Radiculopathy, lumbar region.
TECHNIQUE: Multiplanar, multiecho MR imaging of the lumbar spine was performed, including T1-weighted and fluid sensitive sequences without intravenous contrast.

[Series 18: t2_sag · sagittal · 4.0mm · 0.74mm/px · 4 of 17 slices shown]
[im 1/17]
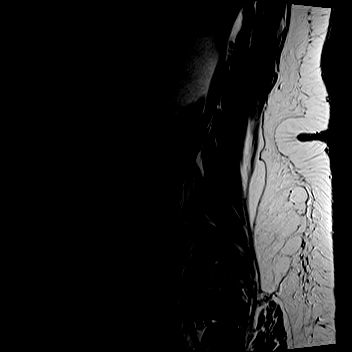
[im 6/17]
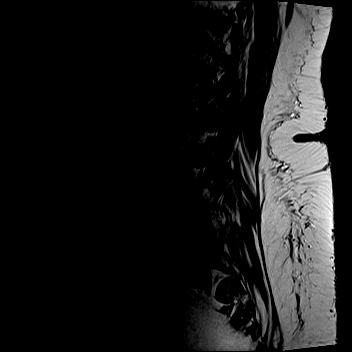
[im 11/17]
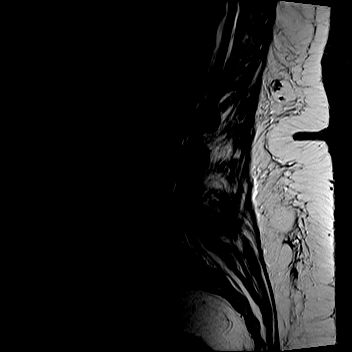
[im 17/17]
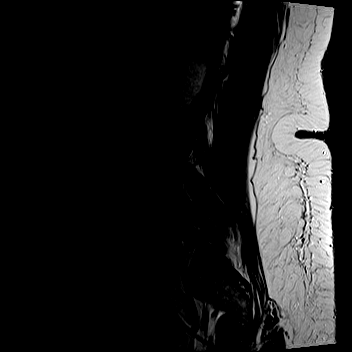

[Series 19: t1_sag · sagittal · 4.0mm · 0.81mm/px · 5 of 17 slices shown]
[im 1/17]
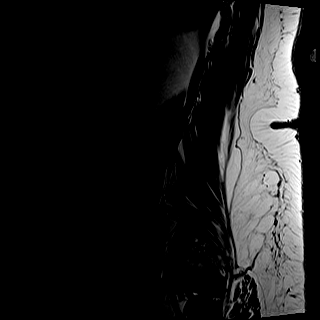
[im 5/17]
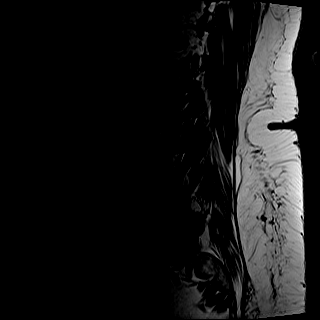
[im 9/17]
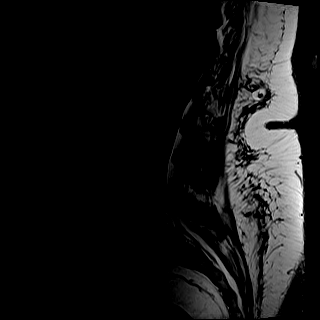
[im 13/17]
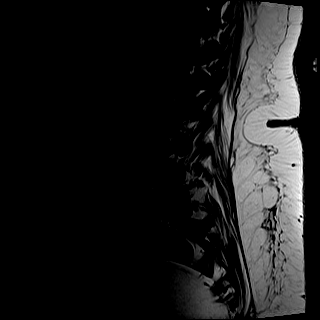
[im 17/17]
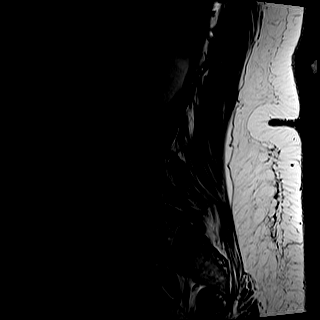

[Series 20: ir_sag · sagittal · 4.0mm · 1.02mm/px · 5 of 17 slices shown]
[im 1/17]
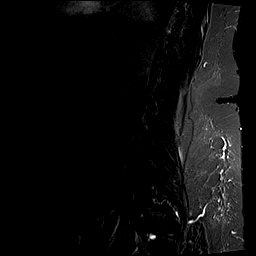
[im 5/17]
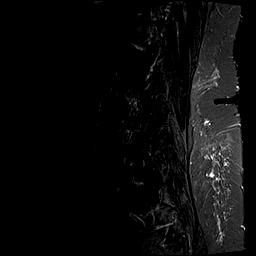
[im 9/17]
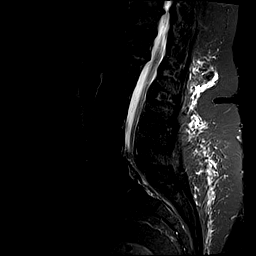
[im 13/17]
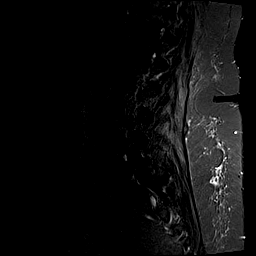
[im 17/17]
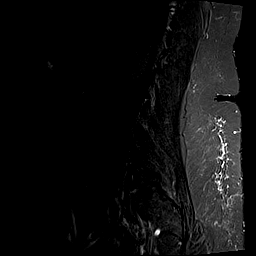

[Series 21: t2_axial · axial · 4.0mm · 0.62mm/px · z∈[-408,-202]mm · 9 of 44 slices shown]
[im 1/44]
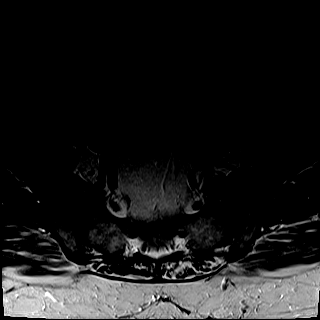
[im 8/44]
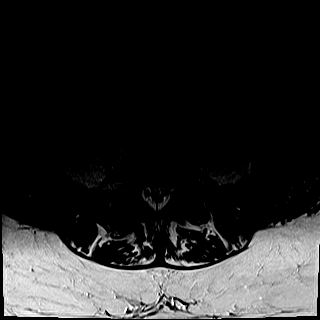
[im 12/44]
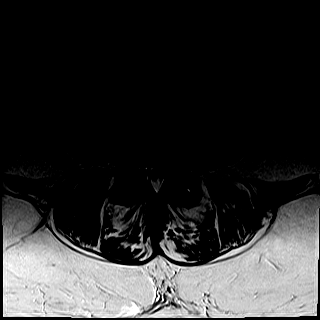
[im 20/44]
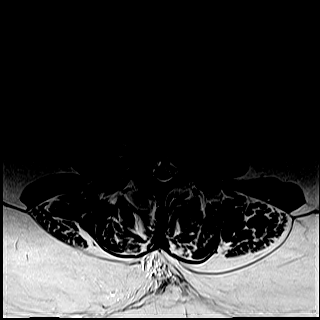
[im 24/44]
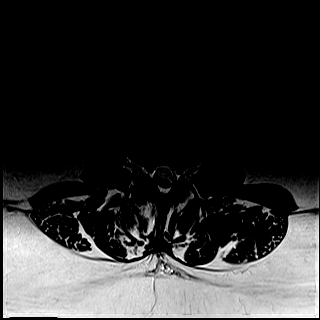
[im 32/44]
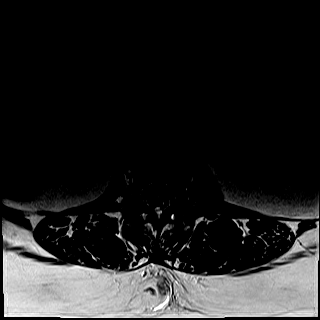
[im 36/44]
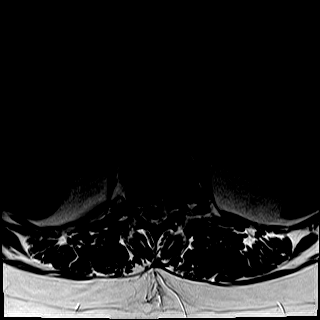
[im 40/44]
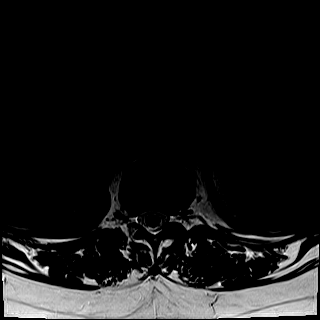
[im 44/44]
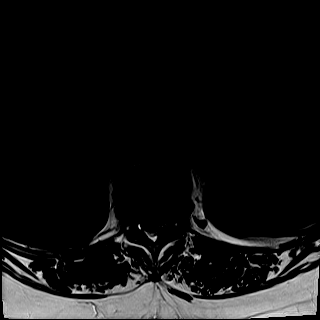

[Series 22: t1_axial_obl · axial · 3.0mm · 0.43mm/px · z∈[-440,-239]mm · 7 of 26 slices shown]
[im 1/26]
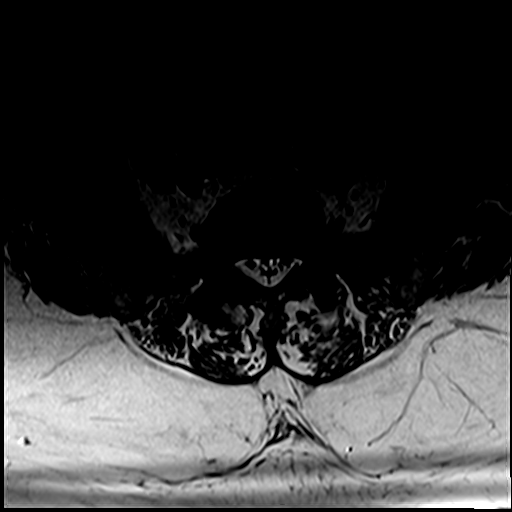
[im 5/26]
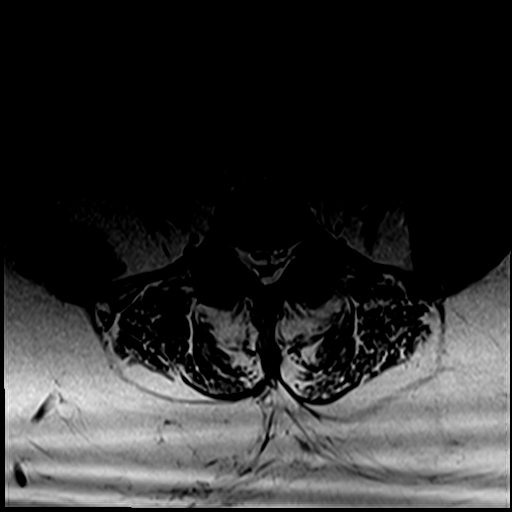
[im 9/26]
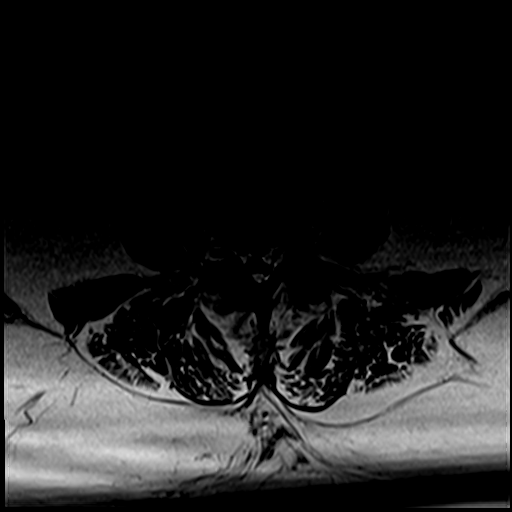
[im 13/26]
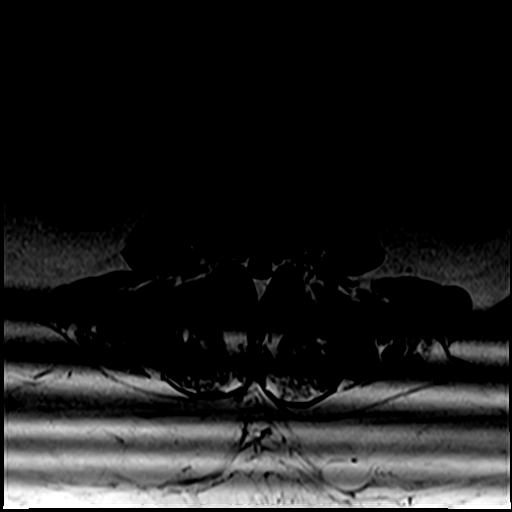
[im 17/26]
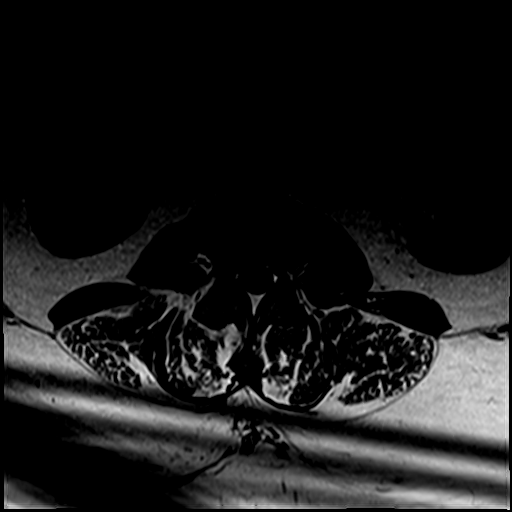
[im 21/26]
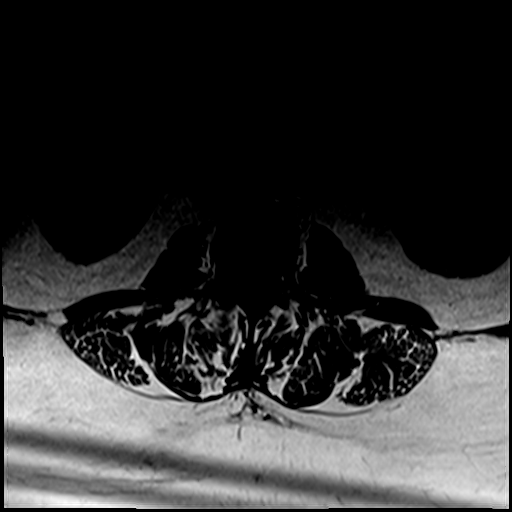
[im 26/26]
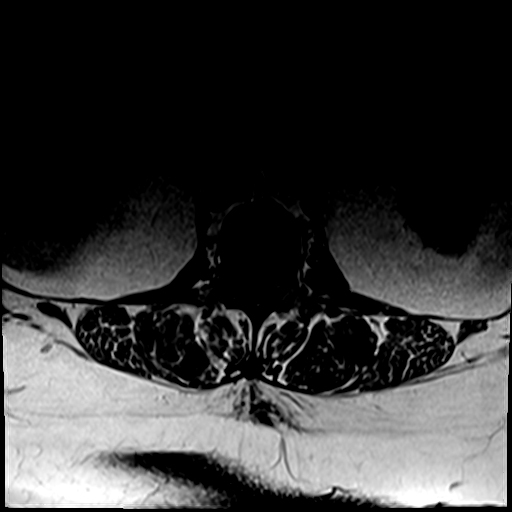

[Series 5000: autoalign verification · sagittal · 4.0mm · 0.74mm/px · 1 of 3 slices shown]
[im 1/3]
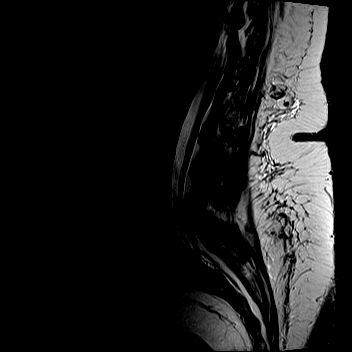

[Series 5010: coronal lspine · coronal · 3.0mm · 0.78mm/px · 1 of 49 slices shown]
[im 1/49]
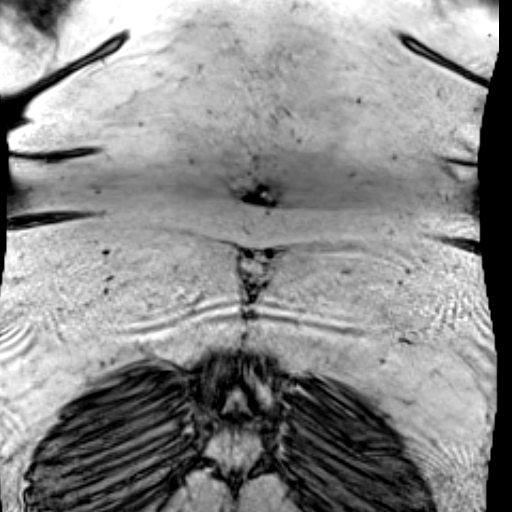

[32 of 48 positions shown; findings below may reference images not displayed]

FINDINGS: Conus: The conus is normal in appearance and position.  

Alignment: [Mild anterolisthesis of L4-L5 and L5-S1. Mild lumbar levoscoliosis, centered at L4. 

Marrow: Mild-moderate scattered discogenic endplate reactive marrow changes. No acute fracture. No pars defect.  

T10-T11: Moderate disc bulge. Moderate canal stenosis. Mild right foraminal stenosis. Severe left foraminal stenosis. Left-sided facet arthrosis.

T11-T12: Mild disc bulge. No canal stenosis. No foraminal stenosis. Mild bilateral facet arthrosis. 

T12-L1: Mild disc bulge. No facet arthrosis. Mild canal stenosis. Moderate left foraminal stenosis.

L1-L2: Minimal disc bulge. Minimal facet arthrosis. No canal stenosis. No foraminal stenosis.

L2-L3: No disc protrusion, canal stenosis, or foraminal stenosis.

L3-L4: Mild disc bulge. Epidural lipomatosis. Mild right facet arthrosis. No canal stenosis. Mild right foraminal stenosis.

L4-L5: Mild anterolisthesis. Moderate disc bulge. Severe bilateral facet arthrosis. Ligamentum flavum thickening. Moderate canal stenosis. Epidural lipomatosis with significant thecal sac stenosis. Mild left foraminal stenosis.

L5-S1: Mild anterolisthesis. Minimal disc bulge. Severe bilateral facet arthrosis. No canal stenosis. Mild left foraminal stenosis. Epidural lipomatosis with significant thecal sac stenosis.

Visualized SI joints: Moderate DJD of both SI joints, worse on the left, partially imaged.

Visualized soft tissues: Small left renal cyst, incompletely evaluated.
IMPRESSION: 1. Mild anterolisthesis, moderate disc bulge, and severe facet arthrosis at L4-L5 with moderate canal stenosis.

2. Mild anterolisthesis and severe facet arthrosis at L5-S1 with mild left foraminal stenosis.

3. Epidural lipomatosis at L4-S1 with significant thecal sac stenosis.

4. Moderate disc bulge and left facet arthrosis at T10-T11 with moderate canal and severe left foraminal stenosis.

5. Mild disc bulge at T12-L1 with mild canal and moderate left foraminal stenosis.

6. Mild lumbar levoscoliosis, centered at L4.

## 2021-12-10 IMAGING — MR MRI SHOULDER LT WO CONTRAST
4 series · 40 of 40 positions shown · non-contrast
Comparison: None available.

INDICATION: Incomplete rotator-cuff tear/rupture of left shoulder, not trauma.
TECHNIQUE: Multiplanar, multisequence MR imaging of the left shoulder without  contrast.

[Series 7: t2_axial_fs_blade · axial · left · 3.0mm · 0.74mm/px · z∈[-74,+9]mm · 10 of 22 slices shown]
[im 1/22]
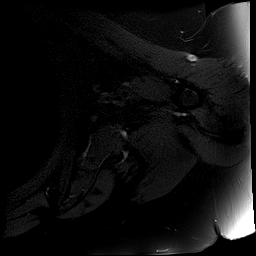
[im 3/22]
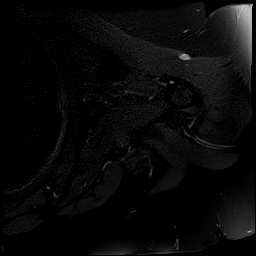
[im 5/22]
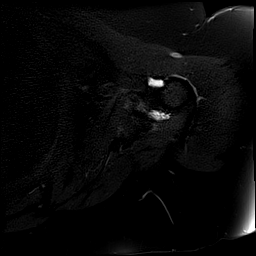
[im 8/22]
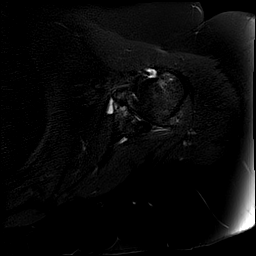
[im 10/22]
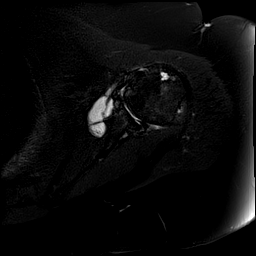
[im 12/22]
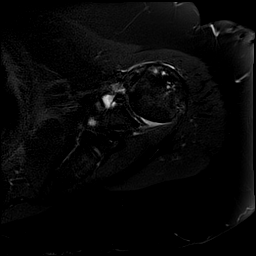
[im 15/22]
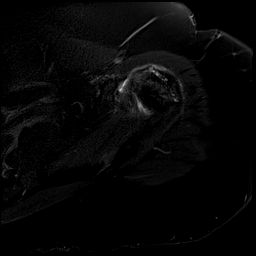
[im 17/22]
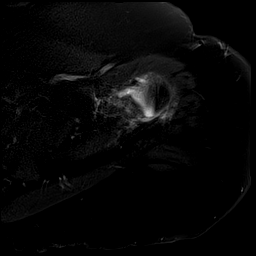
[im 19/22]
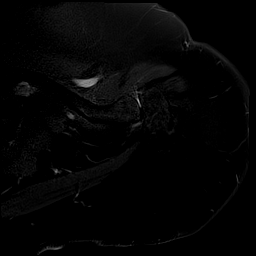
[im 22/22]
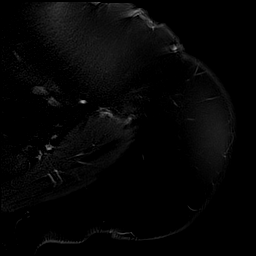

[Series 8: t2_cor_obl_fs_blade · oblique · left · 3.0mm · 0.58mm/px · 8 of 20 slices shown]
[im 1/20]
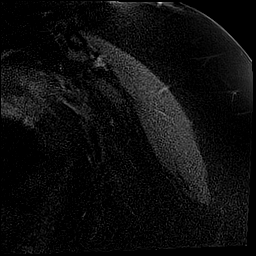
[im 3/20]
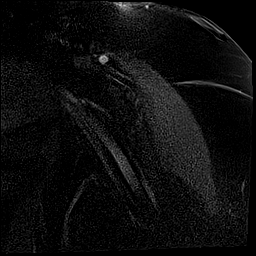
[im 6/20]
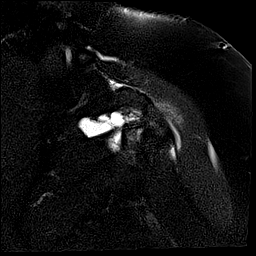
[im 9/20]
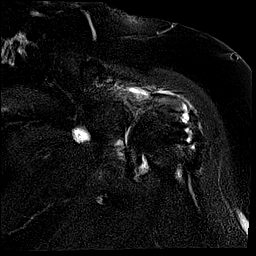
[im 11/20]
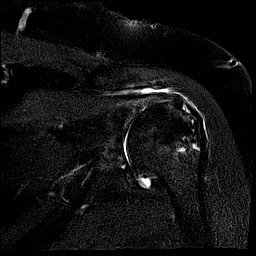
[im 14/20]
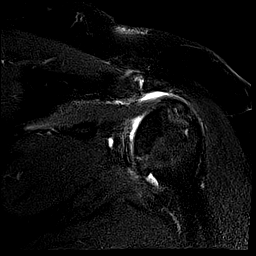
[im 17/20]
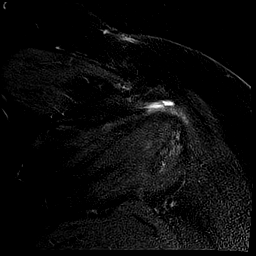
[im 20/20]
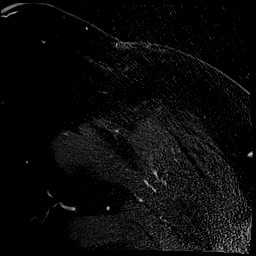

[Series 9: t2_sag_obl_fs_blade · oblique · left · 3.0mm · 0.59mm/px · 11 of 28 slices shown]
[im 1/28]
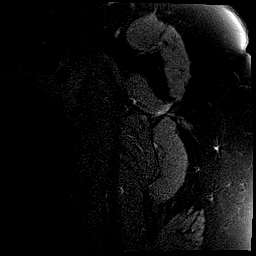
[im 3/28]
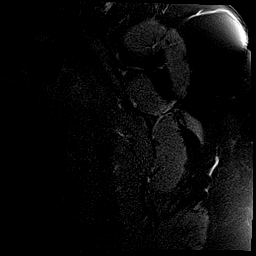
[im 6/28]
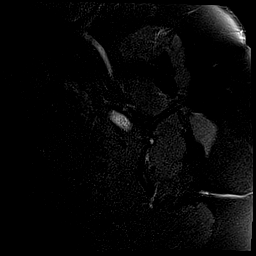
[im 9/28]
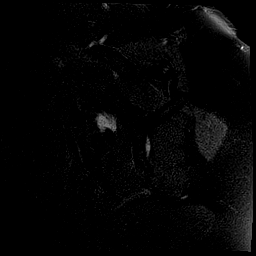
[im 11/28]
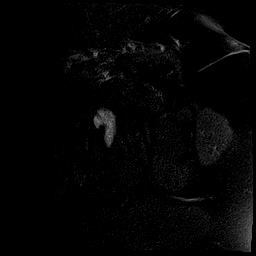
[im 14/28]
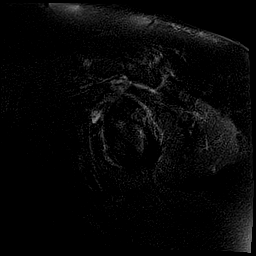
[im 17/28]
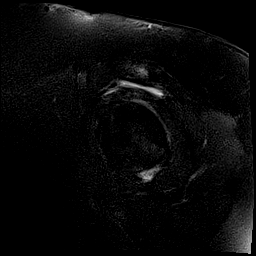
[im 19/28]
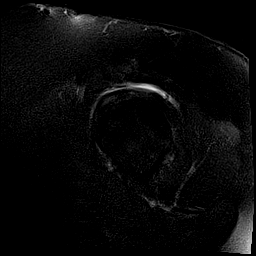
[im 22/28]
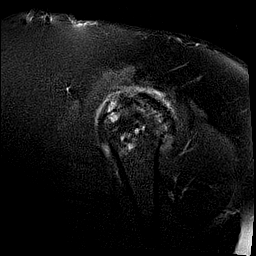
[im 25/28]
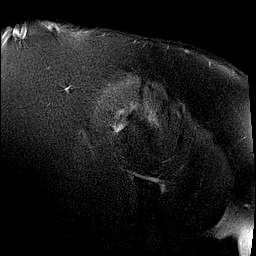
[im 28/28]
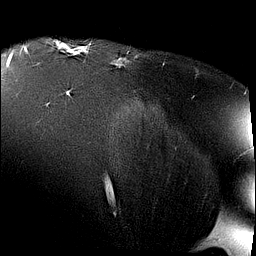

[Series 10: t1_sag_obl · oblique · left · 3.0mm · 0.38mm/px · 11 of 28 slices shown]
[im 1/28]
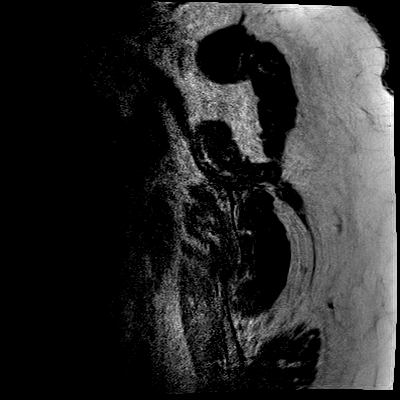
[im 3/28]
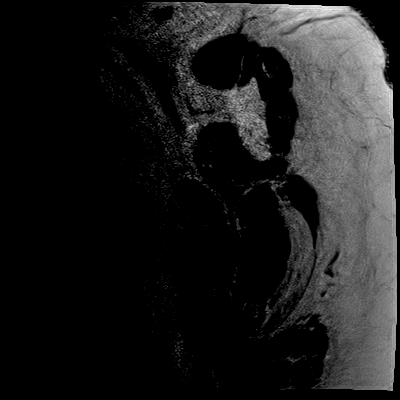
[im 6/28]
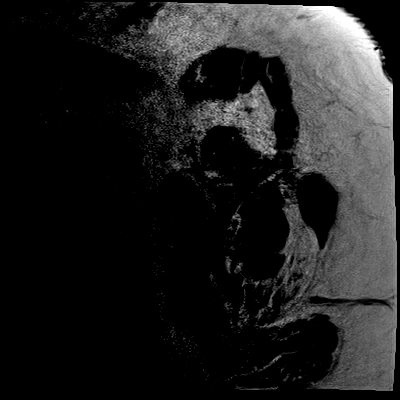
[im 9/28]
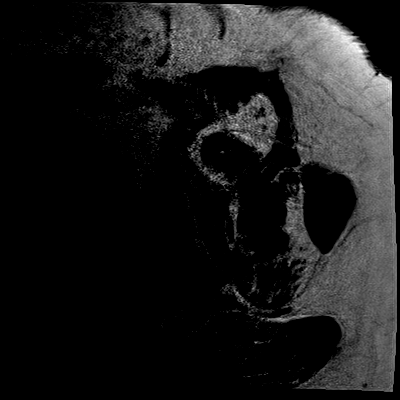
[im 11/28]
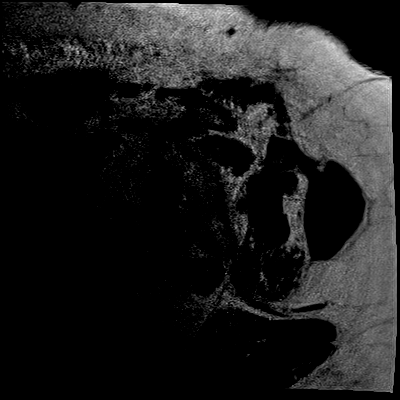
[im 14/28]
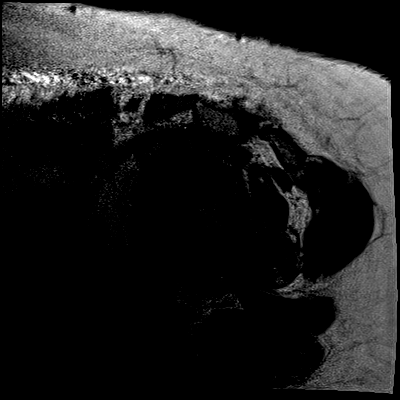
[im 17/28]
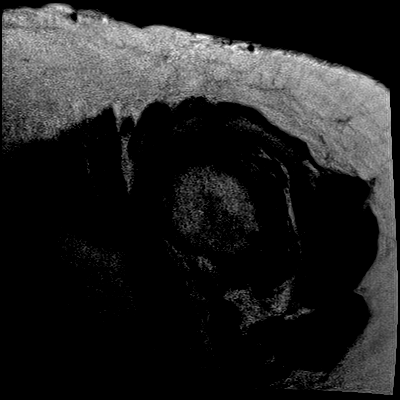
[im 19/28]
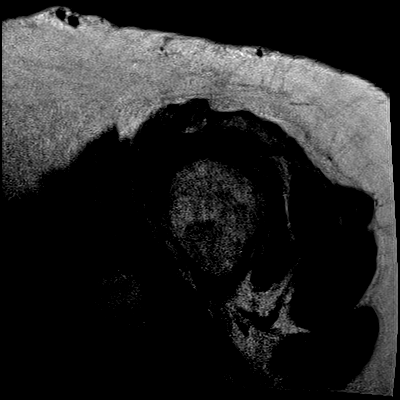
[im 22/28]
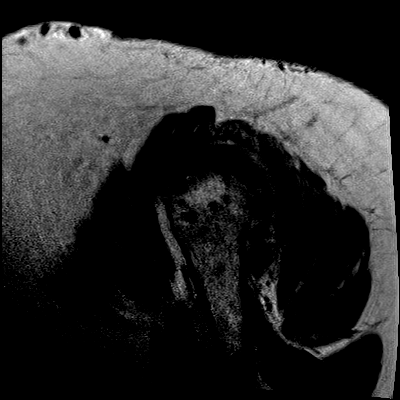
[im 25/28]
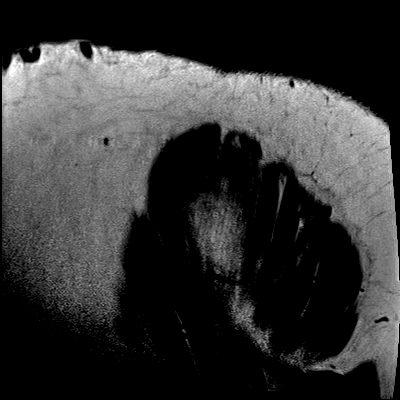
[im 28/28]
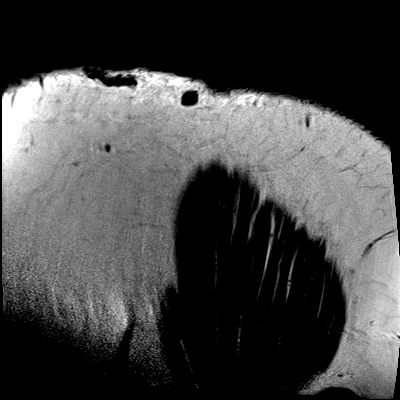

[40 of 40 positions shown; findings below may reference images not displayed]

FINDINGS: OSSEOUS: No acute fracture, avascular necrosis or aggressive osseous lesion. 

ACROMIAL OUTLET: 

Acromioclavicular joint osteoarthritis: Mild

Acromion: Non-hooked.

Subacromial/subdeltoid bursa fluid: Small to moderate.

Coracoclavicular and coracoacromial ligaments: Intact.

ROTATOR CUFF:

Supraspinatus and infraspinatus: Moderate supraspinatus tendinosis, with a moderate size intermediate grade partial-thickness intrasubstance tear at the tendon footprint. Mild infraspinatus tendinosis.

Teres minor: Intact.

Subscapularis: Large high-grade partial-thickness tear.

Rotator cuff muscles: Moderate teres minor muscle fatty atrophy. The supraspinatus, infraspinatus and subscapularis muscles are maintained.

LABRUM: Circumferential labral degeneration and tearing, with suspected labral flap displaced in the superior aspect of the glenohumeral joint from the superior labrum.

BICEPS TENDON: Moderate proximal intra-articular long head biceps tendinosis. The extra-articular long head biceps tendon is intact and normally located.

GLENOHUMERAL JOINT: Diffuse irregular full-thickness and deep partial-thickness chondrosis involving the humeral head and glenoid, with reactive subchondral marrow edema and cystic change along the anterior-inferior glenoid.

OTHER: The inferior glenohumeral ligament is unremarkable. Small glenohumeral joint effusion with ill-defined synovitis or bodies within the axillary recess.
IMPRESSION: 1.
Moderate supraspinatus tendinosis, with a moderate size intermediate grade partial-thickness intrasubstance tear at the tendon footprint. The supraspinatus muscle is maintained.

2.
Large high-grade partial-thickness articular surface tear of the subscapularis tendon. The subscapularis muscle is maintained.

3.
Moderate to severe glenohumeral joint chondrosis, with circumferential labral degeneration and tearing.

4.
Moderate proximal intra-articular long head biceps tendinosis.

5.
Small glenohumeral joint effusion with ill-defined synovitis or bodies within the axillary recess.

## 2022-04-24 ENCOUNTER — Ambulatory Visit: Admit: 2022-04-24 | Discharge: 2022-04-24 | Payer: MEDICARE | Attending: Family | Primary: Family

## 2022-04-24 DIAGNOSIS — Z7689 Persons encountering health services in other specified circumstances: Secondary | ICD-10-CM

## 2022-04-24 MED ORDER — TIZANIDINE HCL 2 MG PO TABS
2 MG | ORAL_TABLET | Freq: Four times a day (QID) | ORAL | 0 refills | Status: DC
Start: 2022-04-24 — End: 2022-07-14

## 2022-04-24 NOTE — Progress Notes (Unsigned)
Subjective:      Chief Complaint   Patient presents with    New Patient     Here to establish, recently moved back from [name]. dx of sleep apnea, 11/21 last sleep study, cpap broke in 12/21, needs a new one, having some lower back pain would like referral to someone who will also manage her shoulder pain too       HPI:  Sue Barber is a 71 y.o. female who presents today to establish care.  She moved from [name] about one month ago.  While in [name] she followed with Dr. Mikeal Hawthorne, Pain Management (3 injections in her lower back - ineffective)    [facility] - Dr. Andrey Campanile and Murtis Sink - left shoulder  Dr. Marcelino Duster - lower back    PCP - OJ Sherlon Handing    OSA  She is using her son's CPAP machine.  Her last sleep study was in November 2021.  [facility]    Past Medical History:   Diagnosis Date    Arthritis     "Back, Knees"    Chronic back pain     Depression     Diverticulosis     History of nuclear stress test 11/27/2015    cardiolite-normal,EF70%    Hyperlipidemia     "Borderline"    Hypertension     Neuropathy     Obesity     Osteoarthritis     Restless legs     Seizures (HCC)     "Had Febrile Seizures Until I Was [age] Year Old"    Shortness of breath on exertion     Sleep apnea     Uses CPAP    Wears glasses         Social History     Tobacco Use    Smoking status: Former     Packs/day: 0.00     Years: 52.00     Pack years: 0.00     Types: Cigarettes     Start date: 06/19/1963     Quit date: 08/16/2018     Years since quitting: 3.6    Smokeless tobacco: Never   Substance Use Topics    Alcohol use: Not Currently     Comment: "Occ. Maybe Once A Month"        Review of Systems        Objective:      BP 101/65 (Site: Right Upper Arm, Position: Sitting, Cuff Size: Large Adult)   Pulse 80   Temp 97.5 F (36.4 C) (Temporal)   Resp 16   Wt 271 lb 3.2 oz (123 kg)   SpO2 98%   BMI 57.67 kg/m      Physical Exam       Assessment / Plan:      1. Encounter to establish care  ***    2. Obesity, morbid, BMI  50 or higher (HCC)  ***    3. OSA on CPAP  ***    4. Vitamin D deficiency  ***    5. Primary hypertension  ***          Penne Lash, APRN - NP

## 2022-04-24 NOTE — Patient Instructions (Signed)
Welcome to [facility]! Did you know we now have a faster way for you to move through your appointment? For your convenience, we now have digital registration available. When you schedule your next appointment, you will receive a link via your e-mail as well as a text message that will allow you to complete any paperwork digitally before your appointment.

## 2022-05-14 ENCOUNTER — Inpatient Hospital Stay: Payer: MEDICARE | Primary: Family

## 2022-05-14 ENCOUNTER — Encounter

## 2022-05-14 ENCOUNTER — Institutional Professional Consult (permissible substitution): Admit: 2022-05-14 | Discharge: 2022-05-14 | Payer: MEDICARE | Attending: Critical Care Medicine | Primary: Family

## 2022-05-14 DIAGNOSIS — R0602 Shortness of breath: Secondary | ICD-10-CM

## 2022-05-14 LAB — CBC WITH AUTO DIFFERENTIAL
Basophils %: 1.2 % — ABNORMAL HIGH (ref 0–1)
Basophils Absolute: 0.1 10*3/uL
Eosinophils %: 1.9 % (ref 0–3)
Eosinophils Absolute: 0.1 10*3/uL
Hematocrit: 48 % — ABNORMAL HIGH (ref 37–47)
Hemoglobin: 15.2 GM/DL (ref 12.5–16.0)
Immature Neutrophil %: 0.3 % (ref 0–0.43)
Lymphocytes %: 24.2 % (ref 24–44)
Lymphocytes Absolute: 1.4 10*3/uL
MCH: 29.2 PG (ref 27–31)
MCHC: 31.7 % — ABNORMAL LOW (ref 32.0–36.0)
MCV: 92.3 FL (ref 78–100)
MPV: 9.6 FL (ref 7.5–11.1)
Monocytes %: 7.4 % — ABNORMAL HIGH (ref 0–4)
Monocytes Absolute: 0.4 10*3/uL
Neutrophils Absolute: 3.8 10*3/uL
Nucleated RBC %: 0 %
Platelets: 239 10*3/uL (ref 140–440)
RBC: 5.2 10*6/uL (ref 4.2–5.4)
RDW: 14.1 % (ref 11.7–14.9)
Segs Relative: 65 % (ref 36–66)
Total Immature Neutrophil: 0.02 10*3/uL
Total Nucleated RBC: 0 10*3/uL
WBC: 5.8 10*3/uL (ref 4.0–10.5)

## 2022-05-14 NOTE — Progress Notes (Signed)
REASON FOR CONSULTATION/CC: OSA on Cpap,needs new machine.      CONSULTING PHYSICIAN: Penne Lash APRN-NP  PCP: Penne Lash, APRN - NP    HISTORY OF PRESENT ILLNESS: Sue Barber is a 71 y.o. year old female with a history of OSA on Cpap, who presents as her Cpap machine broke and needs new machine.Machine was 69 yrs old.Old studies not available.SOB on exertion.C/O occ cough.No f/c.No n/v.No CP.    PAST MEDICAL HISTORY:  Past Medical History:   Diagnosis Date    Arthritis     bilateral shoulders, lower back and both knees    Chronic back pain     Depression     Diverticulosis     Febrile seizure (HCC)     History of nuclear stress test 11/27/2015    cardiolite-normal,EF70%    Hyperlipidemia     "Borderline"    Hypertension     Obesity     Osteoarthritis     Polyneuropathy     Restless legs     Shortness of breath on exertion     Sleep apnea     Uses CPAP    Wears glasses        PAST SURGICAL HISTORY:  Past Surgical History:   Procedure Laterality Date    APPENDECTOMY  1999    Deone During Cholecystectomy    CHOLECYSTECTOMY  1999    Appendectomy    COLONOSCOPY  2001    "Found 2 Polyps"    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    SOCIAL HISTORY:   reports that she quit smoking about 3 years ago. Her smoking use included cigarettes. She started smoking about 58 years ago. She has never used smokeless tobacco.    ALLERGIES:  Patient is allergic to other.    REVIEW OF SYSTEMS:  Constitutional: Negative for fever  HENT: Negative for sore  throat  Eyes: Negative for redness   Cardiovascular: Negative for chest pain  Gastrointestinal: Negative for vomiting, diarrhea    Musculoskeletal: Negative for arthralgias   Skin: Negative for rash  Neurological: Negative for syncope        Objective:   PHYSICAL EXAM:  Pulse 100, temperature 96.9 F (36.1 C), resp. rate 12, height 4' 9.5" (1.461 m), weight 265 lb (120.2 kg), SpO2 97 %, not currently breastfeeding.'    Epworth:       Neck Circumference:     Inches    BMI:  Body mass index is 56.35 kg/m.    Gen: No distress.   Eyes: PERRL. No sclera icterus. No conjunctival injection.   ENT: No discharge. Pharynx clear. External appearance of ears and nose normal.Mallampati score 4.  Neck: Trachea midline. No obvious mass.    Resp: No accessory muscle use. No crackles. No wheezes. No rhonchi. No dullness on percussion.  CV: Regular rate. Regular rhythm. No murmur or rub. No edema.   GI: Non-tender. Non-distended. No hernia.  Skin: Warm, dry, normal texture and turgor. No nodule on exposed extremities.   Lymph: No cervical LAD. No supraclavicular LAD.   M/S: No cyanosis. No clubbing. No joint deformity.    Neuro: Moves all four extremities. CN 2-12 tested, no defect noted.  Psych: Oriented x 3. No anxiety.  Awake. Alert. Intact judgement and insight.    Current Outpatient Medications on File Prior to Visit   Medication Sig Dispense Refill    Cholecalciferol (VITAMIN D3) 1.25 MG (50000 UT) CAPS Take 1 capsule by mouth daily      tiZANidine (ZANAFLEX) 2 MG tablet Take 0.5 tablets by mouth in the morning and 0.5 tablets at noon and 0.5 tablets in the evening and 0.5 tablets before bedtime. 180 tablet 0    gabapentin (NEURONTIN) 400 MG capsule 3 capsules 3 times daily.      buPROPion (WELLBUTRIN XL) 300 MG extended release tablet Take 1 tablet by mouth every morning      amLODIPine-benazepril (LOTREL) 5-20 MG per capsule Take 1 capsule by mouth every morning      loratadine (CLARITIN) 10 MG tablet Take 1 tablet by  mouth every morning Over The Counter      celecoxib (CELEBREX) 200 MG capsule Take 1 capsule by mouth daily       No current facility-administered medications on file prior to visit.     Data Reviewed:       Chest imaging reports were reviewed.    Assessment:     OSA on Cpap, needs new machine.Old studies not available.     Plan:      HST.  CT chest,screening.  C/PFTs.  CBC W diff,IgE level.  RTO 6 wks.            c

## 2022-05-18 LAB — IGE: Immunoglobulin E: 295 kU/L — ABNORMAL HIGH (ref <=214)

## 2022-05-22 ENCOUNTER — Inpatient Hospital Stay: Admit: 2022-05-22 | Payer: MEDICARE | Primary: Family

## 2022-05-22 DIAGNOSIS — Z87891 Personal history of nicotine dependence: Secondary | ICD-10-CM

## 2022-05-29 ENCOUNTER — Encounter

## 2022-05-29 MED ORDER — GABAPENTIN 400 MG PO CAPS
400 MG | ORAL_CAPSULE | Freq: Three times a day (TID) | ORAL | 0 refills | Status: AC
Start: 2022-05-29 — End: 2022-06-28

## 2022-05-29 MED ORDER — GABAPENTIN 800 MG PO TABS
800 MG | ORAL_TABLET | Freq: Three times a day (TID) | ORAL | 0 refills | Status: AC
Start: 2022-05-29 — End: 2022-06-28

## 2022-05-29 NOTE — Telephone Encounter (Signed)
She takes 1200 mg so she will also need 800 mg called in also. She does not have enough to last until her next appointment on 6/27

## 2022-06-23 ENCOUNTER — Encounter: Payer: MEDICARE | Attending: Critical Care Medicine | Primary: Family

## 2022-06-25 ENCOUNTER — Encounter

## 2022-06-25 ENCOUNTER — Ambulatory Visit: Admit: 2022-06-25 | Discharge: 2022-06-25 | Payer: MEDICARE | Attending: Family | Primary: Family

## 2022-06-25 DIAGNOSIS — G8929 Other chronic pain: Secondary | ICD-10-CM

## 2022-06-25 MED ORDER — CETIRIZINE HCL 10 MG PO TABS
10 MG | ORAL_TABLET | Freq: Every day | ORAL | 12 refills | Status: AC
Start: 2022-06-25 — End: 2022-07-25

## 2022-06-25 MED ORDER — BUPROPION HCL ER (XL) 300 MG PO TB24
300 MG | ORAL_TABLET | Freq: Every morning | ORAL | 12 refills | Status: AC
Start: 2022-06-25 — End: 2022-12-17

## 2022-06-25 MED ORDER — CELECOXIB 200 MG PO CAPS
200 MG | ORAL_CAPSULE | Freq: Every day | ORAL | 12 refills | Status: AC
Start: 2022-06-25 — End: 2023-04-26

## 2022-06-25 MED ORDER — AMLODIPINE BESY-BENAZEPRIL HCL 5-20 MG PO CAPS
5-20 MG | ORAL_CAPSULE | Freq: Every morning | ORAL | 12 refills | Status: DC
Start: 2022-06-25 — End: 2023-06-17

## 2022-06-25 NOTE — Progress Notes (Signed)
Subjective:      Chief Complaint   Patient presents with    Follow-up     Pain med, discuss allergy medication, needs handicap placard       HPI:  Sue Barber is a 71 y.o. female who presents today for medication refills and lab work.      Chronic Pain Syndrome  She has a hx of chronic left shoulder pain and low back pain.  She has had three injections in her lower back which were ineffective.  She is currently prescribed Zanaflex 2 mg QID, Celebrex 200 mg daily and Gabapentin 1200 mg TID.  She was referred to [facility] 04/24/2022 but will need a new referral since clinic is closing. Per [facility], Gabapentin last filled 05/29/22. She has difficulty walking long distances due to pain and would like a handicap placard.     Depression  Symptoms are well controlled with current medication.  She denies AVH/SIB/SI/HI.     HTN  Patient with hx of HTN. She is prescribed Lotrel.   She is not exercising and is adherent to a low-salt diet. Blood pressure is well controlled at home. She denies vision changes, headaches, chest pain/tightness/shortness of breath.      Allergies  She has a hx of seasonal allergies.  Symptoms have been worse since moving back to [place].  She endorses sneezing, rhinorrhea, itchy/watery eyes.  She is currently taking Claritin with no relief.     Past Medical History:   Diagnosis Date    Arthritis     bilateral shoulders, lower back and both knees    Chronic back pain     Depression     Diverticulosis     Febrile seizure (HCC)     History of nuclear stress test 11/27/2015    cardiolite-normal,EF70%    Hyperlipidemia     "Borderline"    Hypertension     Obesity     Osteoarthritis     Polyneuropathy     Restless legs     Shortness of breath on exertion     Sleep apnea     Uses CPAP    Wears glasses         Social History     Tobacco Use    Smoking status: Former     Packs/day: 0.50     Years: 52.00     Pack years: 26.00     Types: Cigarettes     Start date: 06/19/1963     Quit date:  08/16/2018     Years since quitting: 3.8    Smokeless tobacco: Never   Substance Use Topics    Alcohol use: Not Currently     Comment: "Occ. Maybe Once A Month"        Review of Systems   Constitutional:  Negative for activity change, appetite change, chills, fatigue, fever and unexpected weight change.   HENT:  Negative for congestion, ear pain, hearing loss, sore throat and tinnitus.    Eyes:  Negative for visual disturbance.   Respiratory:  Positive for apnea. Negative for cough, chest tightness, shortness of breath, wheezing and stridor.         Hx of OSA     Cardiovascular:  Negative for chest pain, palpitations and leg swelling.   Gastrointestinal:  Negative for abdominal pain, constipation, diarrhea, nausea and vomiting.   Musculoskeletal:  Positive for back pain and myalgias. Negative for arthralgias and gait problem.        Hx of  chronic pain     Skin: Negative.    Allergic/Immunologic: Positive for environmental allergies.   Neurological:  Negative for dizziness, tremors, seizures, syncope, speech difficulty, weakness and headaches.   Hematological:  Negative for adenopathy.   Psychiatric/Behavioral:  Negative for behavioral problems, confusion, sleep disturbance and suicidal ideas. The patient is not nervous/anxious.          Objective:      BP (!) 104/50 (Site: Right Upper Arm, Position: Sitting, Cuff Size: Large Adult)   Pulse 84   Temp 97.3 F (36.3 C) (Temporal)   Resp 16   Ht 4' 9.5" (1.461 m)   Wt 260 lb 12.8 oz (118.3 kg)   SpO2 96%   BMI 55.46 kg/m      Physical Exam  Vitals reviewed.   Constitutional:       General: She is not in acute distress.     Appearance: Normal appearance. She is not ill-appearing.   HENT:      Head: Normocephalic.      Right Ear: External ear normal.      Left Ear: External ear normal.      Nose: Rhinorrhea present.      Right Turbinates: Swollen and pale.      Left Turbinates: Swollen and pale.      Mouth/Throat:      Mouth: Mucous membranes are moist.       Pharynx: Oropharynx is clear.   Eyes:      General: Allergic shiner present.      Extraocular Movements: Extraocular movements intact.      Conjunctiva/sclera: Conjunctivae normal.      Pupils: Pupils are equal, round, and reactive to light.   Neck:      Vascular: No carotid bruit.   Cardiovascular:      Rate and Rhythm: Normal rate and regular rhythm.      Heart sounds: Normal heart sounds.   Pulmonary:      Effort: Pulmonary effort is normal.      Breath sounds: Normal breath sounds.   Abdominal:      General: Bowel sounds are normal.      Palpations: Abdomen is soft.      Comments: Exam limited by body habitus     Musculoskeletal:      Right shoulder: Normal.      Left shoulder: Tenderness and crepitus present. Decreased range of motion. Decreased strength.        Arms:       Cervical back: Normal range of motion and neck supple.      Lumbar back: Tenderness present. Decreased range of motion.        Back:       Right lower leg: No edema.      Left lower leg: No edema.      Comments: Hx of chronic left shoulder and low back pain     Skin:     General: Skin is warm and dry.      Capillary Refill: Capillary refill takes less than 2 seconds.      Findings: No erythema or rash.   Neurological:      General: No focal deficit present.      Mental Status: She is alert and oriented to person, place, and time.      Deep Tendon Reflexes: Reflexes normal.   Psychiatric:         Mood and Affect: Mood normal.         Behavior: Behavior  normal.          Assessment / Plan:      1. Chronic bilateral low back pain without sciatica  Pt is scheduled to see pain management.  Medication refilled.  Continue efforts at regular exercise, healthy diet and weight loss.   - celecoxib (CELEBREX) 200 MG capsule; Take 1 capsule by mouth daily  Dispense: 60 capsule; Refill: 12    2. Chronic left shoulder pain  - celecoxib (CELEBREX) 200 MG capsule; Take 1 capsule by mouth daily  Dispense: 60 capsule; Refill: 12    3. Primary hypertension  BP  well controlled, continue current medication.  Regular exercise, low sodium diet and weight loss recommended.  If you smoke stop smoking.  Monitor blood pressures.  Goal is 130/80 or less.  Bring your blood pressure recordings to your next appointment, or you can send them to [place].  Bring your home blood pressure machine with you to your next appointment.   - amLODIPine-benazepril (LOTREL) 5-20 MG per capsule; Take 1 capsule by mouth every morning  Dispense: 30 capsule; Refill: 12    4. MDD (major depressive disorder), recurrent episode, moderate (HCC)  Stable. Continue medication.  Patient to call if any concerns or SI before their follow up appointment.  If she develops suicidal thoughts with a plan, she is instructed to go to emergency room immediately.    Behavioral counseling recommended.     - buPROPion (WELLBUTRIN XL) 300 MG extended release tablet; Take 1 tablet by mouth every morning  Dispense: 30 tablet; Refill: 12    5. Vitamin D deficiency  - Vitamin D 25 Hydroxy    6. Obesity, morbid, BMI 50 or higher (HCC)  Continue efforts at regular exercise, healthy diet and weight loss.   - Comprehensive Metabolic Panel, Fasting  - CBC with Auto Differential  - Lipid, Fasting    The patient,Sue Barber,  was seen with a total time spent of 30 minutes for the visit on this date of service by the E/M provider. The time component had both face to face and non face to face time spent in determining the total time component.  Counseling and education regarding diagnosis listed and options regarding those diagnoses were also included in determining the time component.          Penne Lash, APRN - NP

## 2022-06-26 LAB — CBC WITH AUTO DIFFERENTIAL
Basophils %: 1.3 %
Basophils Absolute: 0.1 10*3/uL (ref 0.0–0.2)
Eosinophils %: 1.9 %
Eosinophils Absolute: 0.1 10*3/uL (ref 0.0–0.6)
Hematocrit: 46.3 % (ref 36.0–48.0)
Hemoglobin: 15.8 g/dL (ref 12.0–16.0)
Lymphocytes %: 23.4 %
Lymphocytes Absolute: 1 10*3/uL (ref 1.0–5.1)
MCH: 31.1 pg (ref 26.0–34.0)
MCHC: 34 g/dL (ref 31.0–36.0)
MCV: 91.4 fL (ref 80.0–100.0)
MPV: 8.3 fL (ref 5.0–10.5)
Monocytes %: 7.7 %
Monocytes Absolute: 0.3 10*3/uL (ref 0.0–1.3)
Neutrophils %: 65.7 %
Neutrophils Absolute: 2.9 10*3/uL (ref 1.7–7.7)
Platelets: 191 10*3/uL (ref 135–450)
RBC: 5.07 M/uL (ref 4.00–5.20)
RDW: 14.5 % (ref 12.4–15.4)
WBC: 4.4 10*3/uL (ref 4.0–11.0)

## 2022-06-26 LAB — COMPREHENSIVE METABOLIC PANEL, FASTING
ALT: 20 U/L (ref 10–40)
AST: 20 U/L (ref 15–37)
Albumin/Globulin Ratio: 1.7 (ref 1.1–2.2)
Albumin: 4.3 g/dL (ref 3.4–5.0)
Alkaline Phosphatase: 66 U/L (ref 40–129)
Anion Gap: 16 (ref 3–16)
BUN: 14 mg/dL (ref 7–20)
CO2: 24 mmol/L (ref 21–32)
Calcium: 9.7 mg/dL (ref 8.3–10.6)
Chloride: 100 mmol/L (ref 99–110)
Creatinine: 1 mg/dL (ref 0.6–1.2)
Est, Glom Filt Rate: >60 (ref >60)
Glucose, Fasting: 100 mg/dL — ABNORMAL HIGH (ref 70–99)
Potassium: 4.6 mmol/L (ref 3.5–5.1)
Sodium: 140 mmol/L (ref 136–145)
Total Bilirubin: <0.2 mg/dL (ref 0.0–1.0)
Total Protein: 6.9 g/dL (ref 6.4–8.2)

## 2022-06-26 LAB — LIPID, FASTING
Cholesterol, Fasting: 180 mg/dL (ref 0–199)
HDL: 40 mg/dL (ref 40–60)
LDL Calculated: 104 mg/dL — ABNORMAL HIGH (ref <100)
Triglyceride, Fasting: 181 mg/dL — ABNORMAL HIGH (ref 0–150)
VLDL Cholesterol Calculated: 36 mg/dL

## 2022-06-26 LAB — VITAMIN D 25 HYDROXY: Vit D, 25-Hydroxy: 63.5 ng/mL (ref >=30)

## 2022-07-02 ENCOUNTER — Encounter

## 2022-07-02 MED ORDER — GABAPENTIN 400 MG PO CAPS
400 MG | ORAL_CAPSULE | Freq: Three times a day (TID) | ORAL | 0 refills | Status: DC
Start: 2022-07-02 — End: 2022-07-30

## 2022-07-02 MED ORDER — GABAPENTIN 800 MG PO TABS
800 MG | ORAL_TABLET | Freq: Three times a day (TID) | ORAL | 0 refills | Status: AC
Start: 2022-07-02 — End: 2022-08-01

## 2022-07-14 ENCOUNTER — Encounter

## 2022-07-14 MED ORDER — TIZANIDINE HCL 2 MG PO TABS
2 MG | ORAL_TABLET | Freq: Three times a day (TID) | ORAL | 0 refills | Status: DC
Start: 2022-07-14 — End: 2022-12-17

## 2022-07-14 NOTE — Telephone Encounter (Signed)
Patient states last prescription was not right, states it was that she takes 0.5 mg QID, but she takes 1 tab TID so she has ran out, needs a new script to reflect how she takes tizanidine 2 mg 1 tab TID

## 2022-07-21 ENCOUNTER — Ambulatory Visit: Admit: 2022-07-21 | Discharge: 2022-07-21 | Payer: MEDICARE | Attending: Critical Care Medicine | Primary: Family

## 2022-07-21 DIAGNOSIS — Z9989 Dependence on other enabling machines and devices: Secondary | ICD-10-CM

## 2022-07-21 NOTE — Progress Notes (Signed)
Progress Note    Subjective:   The patient RXV:QMGQ OSA.AHI 11.CT chest:NAD.CBC OK.IgE elevated.H/O seasonal allergies.Zyrtec helping.  Shortness of breath has improved  Chest pain none  Addressing respiratory complaints Patient is negative for  hemoptysis and cyanosis  CONSTITUTIONAL:  negative for fevers and chills      Past Medical History:     has a past medical history of Arthritis, Chronic back pain, Depression, Diverticulosis, Febrile seizure (HCC), History of nuclear stress test, Hyperlipidemia, Hypertension, Obesity, Osteoarthritis, Polyneuropathy, Restless legs, Shortness of breath on exertion, Sleep apnea, and Wears glasses.    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    SOCIAL HISTORY:   reports that she quit smoking about 3 years ago. Her smoking use included cigarettes. She started smoking about 59 years ago. She has a 26.00 pack-year smoking history. She has never used smokeless tobacco. She reports that she does not currently use alcohol. She reports current drug use. Drug: Marijuana Sheran Fava).    Family history:  family history includes Alcohol Abuse in her father; Arthritis in her sister; Diabetes in her brother; Heart Disease in her brother, father, and mother; High Blood Pressure in her father and mother; Mental Illness in her son; Obesity in her mother and sister; Other in her brother, brother, sister, and son; Psoriasis in her sister and son; Stroke in her mother.          Objective:   PHYSICAL EXAM:        VITALS:  Pulse 94   Temp 97.1 F (36.2 C)   Ht 4\' 9"  (1.448 m)   Wt 260 lb (117.9 kg)   SpO2 95%   BMI 56.26 kg/m     24HR INTAKE/OUTPUT:  No intake or output data in the 24 hours ending 07/21/22 1540    CONSTITUTIONAL:  awake, alert, cooperative, no apparent distress, and appears stated age  LUNGS:   clear to auscultation   CARDIOVASCULAR:  normal S1 and S2 and negative JVD  07/23/22 soft, non-tender. BS normal. No masses,  No organomegaly  NEURO:Alert and oriented x3. Gait normal. Reflexes and motor strength normal and symmetric. Cranial nerves 2-12 and sensation grossly intact.      DATA:    CBC:  No results for input(s): WBC, RBC, HGB, HCT, PLT, MCV, MCH, MCHC, RDW, NRBC, SEGSPCT, BANDSPCT in the last 72 hours.   BMP:  No results for input(s): NA, K, CL, CO2, BUN, CREATININE, CALCIUM, GLUCOSE in the last 72 hours.   ABG:  No results for input(s): PH, PO2ART, PCO2ART, HCO3, BEART, O2SAT in the last 72 hours.  No results found for: PROBNP, THEOPH  No results found for: CULTRESP  Radiology Review:  Pertinent images / reports were reviewed as a part of this visit.      Assessment:     Patient Active Problem List   Diagnosis    OSA on CPAP    Vitamin D deficiency    Primary hypertension    Obesity, morbid, BMI 50 or higher (HCC)    Chronic bilateral low back pain without sciatica    Chronic left shoulder pain    Depression    MDD (major depressive disorder), recurrent episode, moderate (HCC)       Plan:   1. Overall the patient has improved.  2. Arrange new Cpap machine.  3. RTO 4 mths.  Dauntae Derusha Lorrin Jackson, MD   07/21/2022  3:40 PM

## 2022-07-29 ENCOUNTER — Encounter

## 2022-07-30 ENCOUNTER — Encounter

## 2022-07-30 MED ORDER — GABAPENTIN 800 MG PO TABS
800 MG | ORAL_TABLET | ORAL | 0 refills | Status: AC
Start: 2022-07-30 — End: 2022-08-29

## 2022-07-30 MED ORDER — GABAPENTIN 400 MG PO CAPS
400 MG | ORAL_CAPSULE | ORAL | 0 refills | Status: DC
Start: 2022-07-30 — End: 2022-08-28

## 2022-07-30 NOTE — Telephone Encounter (Signed)
Patient called and made an appointment for 08/17/22

## 2022-08-17 ENCOUNTER — Ambulatory Visit: Admit: 2022-08-17 | Discharge: 2022-08-17 | Payer: MEDICARE | Attending: Family | Primary: Family

## 2022-08-17 DIAGNOSIS — R2243 Localized swelling, mass and lump, lower limb, bilateral: Secondary | ICD-10-CM

## 2022-08-17 NOTE — Progress Notes (Signed)
Subjective:      Chief Complaint   Patient presents with    Other     Knots under skin, several on both legs, some from ankle to groin, causes pain       HPI:  Sue Barber is a 71 y.o. female who presents today for the following:     Patient complains of painful knot on both legs.  Symptoms are prominent on both legs, the left greater than right, bilateral. Her legs are painful to the touch and itch at night.    Symptoms have been ongoing for about 2.5 years.  Symptoms have gradually worsened. Patient has been evaluated for this previously.  Evaluation to date has included: x-rays which were negative.  No ultrasound has been done.  She reports that the knots have resolved when she gets steroid injections but always return.      Past Medical History:   Diagnosis Date    Arthritis     bilateral shoulders, lower back and both knees    Chronic back pain     Depression     Diverticulosis     Febrile seizure (HCC)     History of nuclear stress test 11/27/2015    cardiolite-normal,EF70%    Hyperlipidemia     "Borderline"    Hypertension     Obesity     Osteoarthritis     Polyneuropathy     Restless legs     Shortness of breath on exertion     Sleep apnea     Uses CPAP    Wears glasses         Social History     Tobacco Use    Smoking status: Former     Packs/day: 0.50     Years: 52.00     Pack years: 26.00     Types: Cigarettes     Start date: 06/19/1963     Quit date: 08/16/2018     Years since quitting: 4.0    Smokeless tobacco: Never   Substance Use Topics    Alcohol use: Not Currently     Comment: "Occ. Maybe Once A Month"        Review of Systems   Constitutional:  Negative for activity change, appetite change, chills, fatigue, fever and unexpected weight change.   HENT:  Negative for congestion, ear pain, hearing loss, sore throat and tinnitus.    Eyes:  Negative for visual disturbance.   Respiratory:  Negative for cough, chest tightness, shortness of breath, wheezing and stridor.    Cardiovascular:  Positive for  leg swelling. Negative for chest pain and palpitations.   Gastrointestinal:  Negative for abdominal pain, constipation, diarrhea, nausea and vomiting.   Musculoskeletal:  Negative for arthralgias and gait problem.   Skin: Negative.  Negative for color change.   Neurological:  Negative for dizziness, tremors, seizures, syncope, speech difficulty, weakness and headaches.   Hematological:  Negative for adenopathy.   Psychiatric/Behavioral:  Negative for behavioral problems, confusion, sleep disturbance and suicidal ideas. The patient is not nervous/anxious.          Objective:      BP 136/74 (Site: Right Upper Arm, Position: Sitting, Cuff Size: Large Adult)   Pulse 78   Temp 98.1 F (36.7 C) (Oral)   Resp 16   Ht 4\' 9"  (1.448 m)   Wt 258 lb 9.6 oz (117.3 kg)   SpO2 95%   BMI 55.96 kg/m      Physical Exam  Vitals  reviewed.   Constitutional:       General: She is not in acute distress.     Appearance: Normal appearance. She is not ill-appearing.   HENT:      Head: Normocephalic.      Right Ear: External ear normal.      Left Ear: External ear normal.      Mouth/Throat:      Mouth: Mucous membranes are moist.      Pharynx: Oropharynx is clear.   Eyes:      Extraocular Movements: Extraocular movements intact.      Conjunctiva/sclera: Conjunctivae normal.      Pupils: Pupils are equal, round, and reactive to light.   Neck:      Vascular: No carotid bruit.   Cardiovascular:      Rate and Rhythm: Normal rate and regular rhythm.      Heart sounds: Normal heart sounds.   Pulmonary:      Effort: Pulmonary effort is normal.      Breath sounds: Normal breath sounds.   Musculoskeletal:         General: Normal range of motion.      Cervical back: Normal range of motion and neck supple.      Right lower leg: No edema.      Left lower leg: No edema.   Skin:     General: Skin is warm and dry.      Capillary Refill: Capillary refill takes less than 2 seconds.   Neurological:      General: No focal deficit present.      Mental  Status: She is alert and oriented to person, place, and time.          Assessment / Plan:      1. Subcutaneous nodule of both lower legs  Will order [place] to assess for circulatory disorders.  Differential diagnosis includes varicose veins, panniculitis.  Will consider blood work and referral to rheumatology for further evaluation.   - [place] DUP LOWER EXTREMITY LEFT VEN; Future  - [place] DUP LOWER EXTREMITY RIGHT VEN (aka DUPLEX); Future    2. Swelling of lower leg  - [place] DUP LOWER EXTREMITY LEFT VEN; Future  - [place] DUP LOWER EXTREMITY RIGHT VEN (aka DUPLEX); Future    3. Pain in both lower extremities  - [place] DUP LOWER EXTREMITY LEFT VEN; Future  - [place] DUP LOWER EXTREMITY RIGHT VEN (aka DUPLEX); Future           Penne Lash, APRN - NP

## 2022-08-24 ENCOUNTER — Inpatient Hospital Stay: Admit: 2022-08-24 | Payer: MEDICARE | Primary: Family

## 2022-08-24 ENCOUNTER — Encounter

## 2022-08-24 DIAGNOSIS — R2243 Localized swelling, mass and lump, lower limb, bilateral: Secondary | ICD-10-CM

## 2022-08-25 ENCOUNTER — Encounter

## 2022-08-28 ENCOUNTER — Encounter

## 2022-08-28 ENCOUNTER — Inpatient Hospital Stay: Payer: MEDICARE | Primary: Family

## 2022-08-28 DIAGNOSIS — R2243 Localized swelling, mass and lump, lower limb, bilateral: Secondary | ICD-10-CM

## 2022-08-28 LAB — URIC ACID: Uric Acid: 6.3 MG/DL — ABNORMAL HIGH (ref 2.6–6.0)

## 2022-08-28 LAB — C-REACTIVE PROTEIN: CRP High Sensitivity: 13.7 mg/L — ABNORMAL HIGH (ref <5.0)

## 2022-08-28 LAB — SEDIMENTATION RATE: Sed Rate: 4 MM/HR (ref 0–30)

## 2022-08-28 MED ORDER — GABAPENTIN 400 MG PO CAPS
400 MG | ORAL_CAPSULE | ORAL | 0 refills | Status: DC
Start: 2022-08-28 — End: 2022-09-25

## 2022-08-28 MED ORDER — GABAPENTIN 800 MG PO TABS
800 MG | ORAL_TABLET | ORAL | 0 refills | Status: AC
Start: 2022-08-28 — End: 2022-09-27

## 2022-08-30 LAB — RHEUMATOID FACTOR: Rheumatoid Factor: 11 IU/mL (ref 0–14)

## 2022-08-31 LAB — ANA: ANA: NOT DETECTED

## 2022-09-01 ENCOUNTER — Encounter

## 2022-09-01 MED ORDER — ALLOPURINOL 300 MG PO TABS
300 MG | ORAL_TABLET | Freq: Every day | ORAL | 1 refills | Status: DC
Start: 2022-09-01 — End: 2022-12-17

## 2022-09-25 ENCOUNTER — Encounter

## 2022-09-25 ENCOUNTER — Encounter: Admit: 2022-09-25 | Discharge: 2022-09-25 | Payer: MEDICARE | Attending: Family | Primary: Family

## 2022-09-25 DIAGNOSIS — Z0000 Encounter for general adult medical examination without abnormal findings: Secondary | ICD-10-CM

## 2022-09-25 MED ORDER — IPRATROPIUM-ALBUTEROL 20-100 MCG/ACT IN AERS
20-100 MCG/ACT | Freq: Four times a day (QID) | RESPIRATORY_TRACT | 2 refills | Status: DC
Start: 2022-09-25 — End: 2022-09-25

## 2022-09-25 MED ORDER — GABAPENTIN 800 MG PO TABS
800 MG | ORAL_TABLET | Freq: Three times a day (TID) | ORAL | 2 refills | Status: AC
Start: 2022-09-25 — End: 2022-12-24

## 2022-09-25 MED ORDER — IPRATROPIUM-ALBUTEROL 0.5-2.5 (3) MG/3ML IN SOLN
Freq: Four times a day (QID) | RESPIRATORY_TRACT | 5 refills | Status: AC
Start: 2022-09-25 — End: 2023-03-24

## 2022-09-25 MED ORDER — NEBULIZER/TUBING/MOUTHPIECE KIT
PACK | Freq: Four times a day (QID) | 0 refills | Status: AC
Start: 2022-09-25 — End: ?

## 2022-09-25 MED ORDER — GABAPENTIN 400 MG PO CAPS
400 MG | ORAL_CAPSULE | Freq: Three times a day (TID) | ORAL | 2 refills | Status: AC
Start: 2022-09-25 — End: 2022-12-24

## 2022-09-25 MED ORDER — BUPROPION HCL ER (XL) 150 MG PO TB24
150 MG | ORAL_TABLET | Freq: Every morning | ORAL | 0 refills | Status: DC
Start: 2022-09-25 — End: 2022-11-20

## 2022-09-25 MED ORDER — PREDNISONE 20 MG PO TABS
20 MG | ORAL_TABLET | Freq: Every day | ORAL | 0 refills | Status: AC
Start: 2022-09-25 — End: 2022-09-30

## 2022-09-25 NOTE — Patient Instructions (Signed)
Preventing Falls: Care Instructions    Talk to your doctor about the medicines you take. Ask if any of them increase the risk of falls and whether they can be changed or stopped.   Try to exercise regularly. It can help improve your strength and balance. This can help lower your risk of falling.     Practice fall safety and prevention.    Wear low-heeled shoes that fit well and give your feet good support. Talk to your doctor if you have foot problems that make this hard.  Carry a cellphone or wear a medical alert device that you can use to call for help.  Use stepladders instead of chairs to reach high objects. Don't climb if you're at risk for falls. Ask for help, if needed.  Wear the correct eyeglasses, if you need them.    Make your home safer.    Remove rugs, cords, clutter, and furniture from walkways.  Keep your house well lit. Use night-lights in hallways and bathrooms.  Install and use sturdy handrails on stairways.  Wear nonskid footwear, even inside. Don't walk barefoot or in socks without shoes.    Be safe outside.    Use handrails, curb cuts, and ramps whenever possible.  Keep your hands free by using a shoulder bag or backpack.  Try to walk in well-lit areas. Watch out for uneven ground, changes in pavement, and debris.  Be careful in the winter. Walk on the grass or gravel when sidewalks are slippery. Use de-icer on steps and walkways. Add non-slip devices to shoes.    Put grab bars and nonskid mats in your shower or tub and near the toilet. Try to use a shower chair or bath bench when bathing.   Get into a tub or shower by putting in your weaker leg first. Get out with your strong side first. Have a phone or medical alert device in the bathroom with you.   Where can you learn more?  Go to https://www.bennett.info/ and enter G117 to learn more about "Preventing Falls: Care Instructions."  Current as of: July 18, 2023Content Version: 13.8   2006-2023 Healthwise,  Incorporated.   Care instructions adapted under license by [facility]. If you have questions about a medical condition or this instruction, always ask your healthcare professional. [place] any warranty or liability for your use of this information.           Learning About Mindfulness for Stress  What are mindfulness and stress?     Stress is your body's response to a hard situation. Your body can have a physical, emotional, or mental response. A lot of things can cause stress. You may feel stress when you go on a job interview, take a test, or run a race. This kind of short-term stress is normal and even useful. It can help you if you need to work hard or react quickly.  Stress also can last a long time. Long-term stress is caused by stressful situations or events. Examples of long-term stress include long-term health problems, ongoing problems at work, and conflicts in your family. Long-term stress can harm your health.  Mindfulness is a focus only on things happening in the present moment. It's a process of purposefully paying attention to and being aware of your surroundings, your emotions, your thoughts, and how your body feels. You are aware of these things, but you aren't judging these experiences as "good" or "bad." Mindfulness can help you learn to calm your mind  and body to help you cope with illness, pain, and stress.  How does mindfulness help to relieve stress?  Mindfulness can help quiet your mind and relax your body. Studies show that it can help some people sleep better, feel less anxious, and bring their blood pressure down. And it's been shown to help some people live and cope better with certain health problems like heart disease, depression, chronic pain, and cancer.  How do you practice mindfulness?  To be mindful is to pay attention, to be present, and to be accepting. Like any new skill or habit, being mindful can take practice.  When you're mindful, you do just  one thing and you pay close attention to that one thing. For example, you may sit quietly and notice your emotions or how your food tastes and smells.  When you're present, you focus on the things that are happening right now. You let go of your thoughts about the past and the future. When you dwell on the past or the future, you miss moments that can heal and strengthen you. You may miss moments like hearing a child laugh or seeing a friendly face when you think you're all alone.  When you're accepting, you don't judge the present moment. Instead you accept your thoughts and feelings as they come.  You can practice anytime, anywhere, and in any way you choose. You can practice in many ways. Here are a few ideas:  While doing your chores, like washing the dishes, let your mind focus on what's in your hand. What does the dish feel like? Is the water warm or cold?  Go outside and take a few deep breaths. What is the air like? Is it warm or cold?  When you can, take some time at the start of your day to sit alone and think.  Take a slow walk by yourself. Count your steps while you breathe in and out.  Try yoga breathing exercises, stretches, and poses to strengthen and relax your muscles.  At work, if you can, try to stop for a few moments each hour. Note how your body feels. Let yourself regroup and let your mind settle before you return to what you were doing.  If you struggle with anxiety or "worry thoughts," imagine your mind as a blue sky and your worry thoughts as clouds. Now imagine those worry thoughts floating across your mind's sky. Just let them pass by as you watch.  Follow-up care is a key part of your treatment and safety. Be sure to make and go to all appointments, and call your doctor if you are having problems. It's also a good idea to know your test results and keep a list of the medicines you take.  Where can you learn more?  Go to https://www.bennett.info/ and enter M676 to learn more  about "Learning About Mindfulness for Stress."  Current as of: June 25, 2023Content Version: 13.8   2006-2023 Healthwise, Incorporated.   Care instructions adapted under license by [facility]. If you have questions about a medical condition or this instruction, always ask your healthcare professional. [place] any warranty or liability for your use of this information.           Learning About Emotional Support  When do you need emotional support?     You might find getting support from others helpful when you have a long-term health problem. Often people feel alone, confused, or scared when coping with an illness. But you  aren't alone. Other people are going through the same thing you are and know how you feel.  Talking with others about your feelings can help you feel better.  Your family and friends can give you support. So can your doctor, a support group, or a church. If you have a support network, you will not feel as alone. You will learn new ways to deal with your situation, and you may try harder to overcome it.  Where you can get support  Family and friends: They can help you cope by giving you comfort and encouragement.  Counseling: Professional counseling can help you cope with situations that interfere with your life and cause stress. Counseling can help you understand and deal with your illness.  Your doctor: Find a doctor you trust and feel comfortable with. Be open and honest about your fears and concerns. Your doctor can help you get the right medical treatments, including counseling.  Spiritual or religious groups: They can provide comfort and may be able to help you find counseling or other social support services.  Social groups: They can help you meet new people and get involved in activities you enjoy.  Community support groups: In a support group, you can talk to others who have dealt with the same problems or illness as you. You can encourage one  another and learn ways to cope with tough emotions.  How can you find a support group?  Finding a support group that works for you may take time. There are many options. Some groups have a group leader who helps lead discussions or shares information. Others are less formal. Some meet in person, while others meet online.  Try using these resources to help you find the best support group for you.  Your doctor, health care team, or counselor.  People with the same health concern.  Your local church, mosque, synagogue, or other religious group.  A city, state, or national group that provides support for your health concern. Check your local [place] or community center for a list of these groups. Or look for information online.  Your local community, friends, and family.  Supportive relationships  A supportive relationship includes emotional support such as love, trust, and understanding, as well as advice and concrete help, such as help managing your time.  Reach out to others  Family and friends can help you. Ask them to:  Listen to you and give you encouragement. This can keep you from feeling hopeless or alone.  Help with small daily tasks or with bigger problems. A helping hand can keep you from feeling overwhelmed.  Help you manage a health problem. For example, ask them to go to doctor visits with you. Your loved ones can offer support by being involved in your medical care.  Respect your relationships  A good relationship is also a two-way street. You count on help from others, but they also count on you.  Know your friends' limits. You don't have to see or call your friends every day. If you are going through a rough patch, ask friends if you can contact them outside of the usual boundaries.  Don't always complain or talk about yourself. Know when it's time to stop talking and listen or just enjoy your friend's company.  Know that good friends can be a bad influence. For example, if a friend encourages you to  drink when you know it will harm you, you may want to end the friendship.  Where can you learn  more?  Go to https://www.bennett.info/ and enter G092 to learn more about "Learning About Emotional Support."  Current as of: June 25, 2023Content Version: 13.8   2006-2023 Healthwise, Incorporated.   Care instructions adapted under license by [facility]. If you have questions about a medical condition or this instruction, always ask your healthcare professional. [place] any warranty or liability for your use of this information.      For more information on your local Area Agency on Aging or Council on Aging please visit the appropriate web site below:    [place]: [email or web address]    : [email or web address]    [place]: [email or web address]-[email or web address]    [place]: [email or web address].info/           Learning About Being Active as an Older Adult  Why is being active important as you get older?     Being active is one of the best things you can do for your health. And it's never too late to start. Being active--or getting active, if you aren't already--has definite benefits. It can:  Give you more energy,  Keep your mind sharp.  Improve balance to reduce your risk of falls.  Help you manage chronic illness with fewer medicines.  No matter how old you are, how fit you are, or what health problems you have, there is a form of activity that will work for you. And the more physical activity you can do, the better your overall health will be.  What kinds of activity can help you stay healthy?  Being more active will make your daily activities easier. Physical activity includes planned exercise and things you do in daily life. There are four types of activity:  Aerobic.  Doing aerobic activity makes your heart and lungs strong.  Includes walking, dancing, and gardening.  Aim for at least 2 hours spread throughout the week.  It improves  your energy and can help you sleep better.  Muscle-strengthening.  This type of activity can help maintain muscle and strengthen bones.  Includes climbing stairs, using resistance bands, and lifting or carrying heavy loads.  Aim for at least twice a week.  It can help protect the knees and other joints.  Stretching.  Stretching gives you better range of motion in joints and muscles.  Includes upper arm stretches, calf stretches, and gentle yoga.  Aim for at least twice a week, preferably after your muscles are warmed up from other activities.  It can help you function better in daily life.  Balancing.  This helps you stay coordinated and have good posture.  Includes heel-to-toe walking, tai chi, and certain types of yoga.  Aim for at least 3 days a week.  It can reduce your risk of falling.  Even if you have a hard time meeting the recommendations, it's better to be more active than less active. All activity done in each category counts toward your weekly total. You'd be surprised how daily things like carrying groceries, keeping up with grandchildren, and taking the stairs can add up.  What keeps you from being active?  If you've had a hard time being more active, you're not alone. Maybe you remember being able to do more. Or maybe you've never thought of yourself as being active. It's frustrating when you can't do the things you want. Being more active can help. What's holding you back?  Getting started.  Have a goal, but break it into easy tasks. Small steps build into big accomplishments.  Staying motivated.  If  you feel like skipping your activity, remember your goal. Maybe you want to move better and stay independent. Every activity gets you one step closer.  Not feeling your best.  Start with 5 minutes of an activity you enjoy. Prove to yourself you can do it. As you get comfortable, increase your time.  You may not be where you want to be. But you're in the process of getting there. Everyone starts  somewhere.  How can you find safe ways to stay active?  Talk with your doctor about any physical challenges you're facing. Make a plan with your doctor if you have a health problem or aren't sure how to get started with activity.  If you're already active, ask your doctor if there is anything you should change to stay safe as your body and health change.  If you tend to feel dizzy after you take medicine, avoid activity at that time. Try being active before you take your medicine. This will reduce your risk of falls.  If you plan to be active at home, make sure to clear your space before you get started. Remove things like TV cords, coffee tables, and throw rugs. It's safest to have plenty of space to move freely.  The key to getting more active is to take it slow and steady. Try to improve only a little bit at a time. Pick just one area to improve on at first. And if an activity hurts, stop and talk to your doctor.  Where can you learn more?  Go to https://www.bennett.info/ and enter P600 to learn more about "Learning About Being Active as an Older Adult."  Current as of: June 6, 2023Content Version: 13.8   2006-2023 Healthwise, Incorporated.   Care instructions adapted under license by [facility]. If you have questions about a medical condition or this instruction, always ask your healthcare professional. [place] any warranty or liability for your use of this information.           Learning About Dental Care for Older Adults  Dental care for older adults: Overview  Dental care for older people is much the same as for younger adults. But older adults do have concerns that younger adults do not. Older adults may have problems with gum disease and decay on the roots of their teeth. They may need missing teeth replaced or broken fillings fixed. Or they may have dentures that need to be cared for. Some older adults may have trouble holding a toothbrush.  You can  help remind the person you are caring for to brush and floss their teeth or to clean their dentures. In some cases, you may need to do the brushing and other dental care tasks. People who have trouble using their hands or who have dementia may need this extra help.  How can you help with dental care?  Normal dental care  To keep the teeth and gums healthy:  Brush the teeth with fluoride toothpaste twice a day--in the morning and at night--and floss at least once a day. Plaque can quickly build up on the teeth of older adults.  Watch for the signs of gum disease. These signs include gums that bleed after brushing or after eating hard foods, such as apples.  See a dentist regularly. Many experts recommend checkups every 6 months.  Keep the dentist up to date on any new medications the person is taking.  Encourage a balanced diet that includes whole grains, vegetables, and fruits, and that  is low in saturated fat and sodium.  Encourage the person you're caring for not to use tobacco products. They can affect dental and general health.  Many older adults have a fixed income and feel that they can't afford dental care. But most towns and cities have programs in which dentists help older adults by lowering fees. Contact your area's public health offices or social services for information about dental care in your area.  Using a toothbrush  Older adults with arthritis sometimes have trouble brushing their teeth because they can't easily hold the toothbrush. Their hands and fingers may be stiff, painful, or weak. If this is the case, you can:  Offer an IT trainer toothbrush.  Enlarge the handle of a non-electric toothbrush by wrapping a sponge, an elastic bandage, or adhesive tape around it.  Push the toothbrush handle through a ball made of rubber or soft foam.  Make the handle longer and thicker by taping Popsicle sticks or tongue depressors to it.  You may also be able to buy special toothbrushes, toothpaste dispensers,  and floss holders.  Your doctor may recommend a soft-bristle toothbrush if the person you care for bleeds easily. Bleeding can happen because of a health problem or from certain medicines.  A toothpaste for sensitive teeth may help if the person you care for has sensitive teeth.  How do you brush and floss someone's teeth?  If the person you are caring for has a hard time cleaning their teeth on their own, you may need to brush and floss their teeth for them. It may be easiest to have the person sit and face away from you, and to sit or stand behind them. That way you can steady their head against your arm as you reach around to floss and brush their teeth. Choose a place that has good lighting and is comfortable for both of you.  Before you begin, gather your supplies. You will need gloves, floss, a toothbrush, and a container to hold water if you are not near a sink. Wash and dry your hands well and put on gloves. Start by flossing:  Gently work a piece of floss between each of the teeth toward the gums. A plastic flossing tool may make this easier, and they are available at most drugstores.  Curve the floss around each tooth into a U-shape and gently slide it under the gum line.  Move the floss firmly up and down several times to scrape off the plaque.  After you've finished flossing, throw away the used floss and begin brushing:  Wet the brush and apply toothpaste.  Place the brush at a 45-degree angle where the teeth meet the gums. Press firmly, and move the brush in small circles over the surface of the teeth.  Be careful not to brush too hard. Vigorous brushing can make the gums pull away from the teeth and can scratch the tooth enamel.  Brush all surfaces of the teeth, on the tongue side and on the cheek side. Pay special attention to the front teeth and all surfaces of the back teeth.  Brush chewing surfaces with short back-and-forth strokes.  After you've finished, help the person rinse the remaining  toothpaste from their mouth.  Where can you learn more?  Go to https://www.bennett.info/ and enter F944 to learn more about "[place] for Older Adults."  Current as of: November 13, 2022Content Version: 13.8   2006-2023 Healthwise, Incorporated.   Care instructions adapted under license by [facility]. If you have questions about a medical condition or this instruction, always ask your healthcare professional. [place] any warranty or liability for your use of this information.           Hearing Loss: Care Instructions  Overview     Hearing loss is a sudden or slow decrease in how well you hear. It can range from slight to profound. Permanent hearing loss can occur with aging. It also can happen when you are exposed long-term to loud noise. Examples include listening to loud music, riding motorcycles, or being around other loud machines.  Hearing loss can affect your work and home life. It can make you feel lonely or depressed. You may feel that you have lost your independence. But hearing aids and other devices can help you hear better and feel connected to others.  Follow-up care is a key part of your treatment and safety. Be sure to make and go to all appointments, and call your doctor if you are having problems. It's also a good idea to know your test results and keep a list of the medicines you take.  How can you care for yourself at home?  Avoid loud noises whenever possible. This helps keep your hearing from getting worse.  Always wear hearing protection around loud noises.  Wear a hearing aid as directed.  A professional can help you pick a hearing aid that will work best for you.  You can also get hearing aids over the counter for mild to moderate hearing loss.  Have hearing tests as your doctor suggests. They can show whether your hearing has changed. Your hearing aid may need to be adjusted.  Use other devices as needed. These may  include:  Telephone amplifiers and hearing aids that can connect to a television, stereo, radio, or microphone.  Devices that use lights or vibrations. These alert you to the doorbell, a ringing telephone, or a baby monitor.  Television closed-captioning. This shows the words at the bottom of the screen. Most new TVs can do this.  TTY (text telephone). This lets you type messages back and forth on the telephone instead of talking or listening. These devices are also called TDD. When messages are typed on the keyboard, they are sent over the phone line to a receiving TTY. The message is shown on a monitor.  Use text messaging, social media, and email if it is hard for you to communicate by telephone.  Try to learn a listening technique called speechreading. It is not lipreading. You pay attention to people's gestures, expressions, posture, and tone of voice. These clues can help you understand what a person is saying. Face the person you are talking to, and have them face you. Make sure the lighting is good. You need to see the other person's face clearly.  Think about counseling if you need help to adjust to your hearing loss.  When should you call for help?  Watch closely for changes in your health, and be sure to contact your doctor if:   You think your hearing is getting worse.    You have new symptoms, such as dizziness or nausea.   Where can you learn more?  Go to https://www.bennett.info/ and enter R798 to learn more about "Hearing Loss: Care Instructions."  Current as of: February 28, 2023Content Version: 13.8   2006-2023 Healthwise, Incorporated.   Care instructions adapted under license by [facility]. If you have questions about a medical condition or this  instruction, always ask your healthcare professional. [place] any warranty or liability for your use of this information.           Learning About Vision Tests  What are vision tests?     The four  most common vision tests are visual acuity tests, refraction, visual field tests, and color vision tests.  Visual acuity (sharpness) tests  These tests are used:  To see if you need glasses or contact lenses.  To monitor an eye problem.  To check an eye injury.  Visual acuity tests are done as part of routine exams. You may also have this test when you get your driver's license or apply for some types of jobs.  Visual field tests  These tests are used:  To check for vision loss in any area of your range of vision.  To screen for certain eye diseases.  To look for nerve damage after a stroke, head injury, or other problem that could reduce blood flow to the brain.  Refraction and color tests  A refraction test is done to find the right prescription for glasses and contact lenses.  A color vision test is done to check for color blindness.  Color vision is often tested as part of a routine exam. You may also have this test when you apply for a job where recognizing different colors is important, such as truck driving, Research officer, trade union, or the [employer].  How are vision tests done?  Visual acuity test   You cover one eye at a time.  You read aloud from a wall chart across the room.  You read aloud from a small card that you hold in your hand.  Refraction   You look into a special device.  The device puts lenses of different strengths in front of each eye to see how strong your glasses or contact lenses need to be.  Visual field tests   Your doctor may have you look through special machines.  Or your doctor may simply have you stare straight ahead while they move a finger into and out of your field of vision.  Color vision test   You look at pieces of printed test patterns in various colors. You say what number or symbol you see.  Your doctor may have you trace the number or symbol using a pointer.  How do these tests feel?  There is very little chance of having a problem from this test. If dilating drops are used for a vision  test, they may make the eyes sting and cause a medicine taste in the mouth.  Follow-up care is a key part of your treatment and safety. Be sure to make and go to all appointments, and call your doctor if you are having problems. It's also a good idea to know your test results and keep a list of the medicines you take.  Where can you learn more?  Go to https://www.bennett.info/ and enter G551 to learn more about "Learning About Vision Tests."  Current as of: June 6, 2023Content Version: 13.8   2006-2023 Healthwise, Incorporated.   Care instructions adapted under license by [facility]. If you have questions about a medical condition or this instruction, always ask your healthcare professional. [place] any warranty or liability for your use of this information.           Preventing Falls: Care Instructions    Talk to your doctor about the medicines you take. Ask if any of them  increase the risk of falls and whether they can be changed or stopped.   Try to exercise regularly. It can help improve your strength and balance. This can help lower your risk of falling.     Practice fall safety and prevention.    Wear low-heeled shoes that fit well and give your feet good support. Talk to your doctor if you have foot problems that make this hard.  Carry a cellphone or wear a medical alert device that you can use to call for help.  Use stepladders instead of chairs to reach high objects. Don't climb if you're at risk for falls. Ask for help, if needed.  Wear the correct eyeglasses, if you need them.    Make your home safer.    Remove rugs, cords, clutter, and furniture from walkways.  Keep your house well lit. Use night-lights in hallways and bathrooms.  Install and use sturdy handrails on stairways.  Wear nonskid footwear, even inside. Don't walk barefoot or in socks without shoes.    Be safe outside.    Use handrails, curb cuts, and ramps whenever possible.  Keep your  hands free by using a shoulder bag or backpack.  Try to walk in well-lit areas. Watch out for uneven ground, changes in pavement, and debris.  Be careful in the winter. Walk on the grass or gravel when sidewalks are slippery. Use de-icer on steps and walkways. Add non-slip devices to shoes.    Put grab bars and nonskid mats in your shower or tub and near the toilet. Try to use a shower chair or bath bench when bathing.   Get into a tub or shower by putting in your weaker leg first. Get out with your strong side first. Have a phone or medical alert device in the bathroom with you.   Where can you learn more?  Go to https://www.bennett.info/ and enter G117 to learn more about "Preventing Falls: Care Instructions."  Current as of: July 18, 2023Content Version: 13.8   2006-2023 Healthwise, Incorporated.   Care instructions adapted under license by [facility]. If you have questions about a medical condition or this instruction, always ask your healthcare professional. [place] any warranty or liability for your use of this information.           Learning About Activities of Daily Living  What are activities of daily living?     Activities of daily living (ADLs) are the basic self-care tasks you do every day. As you age, and if you have health problems, you may find that it's harder to do these things for yourself. That's when you may need some help.  Your doctor uses ADLs to measure how much help you need. Knowing what you can and can't do for yourself is an important first step to getting help. And when you have the help you need, you can stay as independent as possible.  Your doctor will want to know if you are able to do tasks such as:  Take a bath or shower without help.  Go to the bathroom by yourself.  Dress and undress without help.  Shave, comb your hair, and brush teeth on your own.  Get in and out of bed or a chair without help.  Feed yourself without  help.  If you are having trouble doing basic self-care tasks, talk with your doctor. You may want to bring a caregiver or family member who can help the doctor understand your needs and abilities.  How will a doctor assess your ADLs?  Asking about ADLs is part of a routine health checkup your doctor will likely do as you age. Your health check might be done in a doctor's office, in your home, or at a hospital. The goal is to find out if you are having any problems that could make your health problems worse or that make it unsafe for you to be on your own.  To measure your ADLs, your doctor will ask how hard it is for you to do routine tasks. He or she may also want to know if you have changed the way you do a task because of a health problem. He or she may watch how you:  Walk back and forth.  Keep your balance while you stand or walk.  Move from sitting to standing or from a bed to a chair.  Button or unbutton a Printmaker.  Remove and put on your shoes.  It's normal to feel a little worried or anxious if you find you can't do all the things you used to be able to do. Talking with your doctor about ADLs isn't a test that you either pass or fail. It's just a way to get more information about your health and safety.  Follow-up care is a key part of your treatment and safety. Be sure to make and go to all appointments, and call your doctor if you are having problems. It's also a good idea to know your test results and keep a list of the medicines you take.  Current as of: February 26, 2023Content Version: 13.8   2006-2023 Healthwise, Incorporated.   Care instructions adapted under license by [facility]. If you have questions about a medical condition or this instruction, always ask your healthcare professional. [place] any warranty or liability for your use of this information.           Advance Directives: Care Instructions  Overview  An advance directive is a legal  way to state your wishes at the end of your life. It tells your family and your doctor what to do if you can't say what you want.  There are two main types of advance directives. You can change them any time your wishes change.  Living will.  This form tells your family and your doctor your wishes about life support and other treatment. The form is also called a declaration.  Medical power of attorney.  This form lets you name a person to make treatment decisions for you when you can't speak for yourself. This person is called a health care agent (health care proxy, health care surrogate). The form is also called a durable power of attorney for health care.  If you do not have an advance directive, decisions about your medical care may be made by a family member, or by a doctor or a judge who doesn't know you.  It may help to think of an advance directive as a gift to the people who care for you. If you have one, they won't have to make tough decisions by themselves.  For more information, including forms for your state, see the [employer] website (RebankingSpace.hu).  Follow-up care is a key part of your treatment and safety. Be sure to make and go to all appointments, and call your doctor if you are having problems. It's also a good idea to know your test results and keep a list of the medicines you take.  What  should you include in an advance directive?  Many states have a unique advance directive form. (It may ask you to address specific issues.) Or you might use a universal form that's approved by many states.  If your form doesn't tell you what to address, it may be hard to know what to include in your advance directive. Use the questions below to help you get started.  Who do you want to make decisions about your medical care if you are not able to?  What life-support measures do you want if you have a serious illness that gets worse over time or can't be cured?  What are you  most afraid of that might happen? (Maybe you're afraid of having pain, losing your independence, or being kept alive by machines.)  Where would you prefer to die? (Your home? A hospital? A nursing home?)  Do you want to donate your organs when you die?  Do you want certain religious practices performed before you die?  When should you call for help?  Be sure to contact your doctor if you have any questions.  Where can you learn more?  Go to https://www.bennett.info/ and enter R264 to learn more about "Advance Directives: Care Instructions."  Current as of: March 26, 2023Content Version: 13.8   2006-2023 Healthwise, Incorporated.   Care instructions adapted under license by [facility]. If you have questions about a medical condition or this instruction, always ask your healthcare professional. [place] any warranty or liability for your use of this information.           Starting a Weight Loss Plan: Care Instructions  Overview     If you're thinking about losing weight, it can be hard to know where to start. Your doctor can help you set up a weight loss plan that best meets your needs. You may want to take a class on nutrition or exercise, or you could join a weight loss support group. If you have questions about how to make changes to your eating or exercise habits, ask your doctor about seeing a registered dietitian or an exercise specialist.  It can be a big challenge to lose weight. But you don't have to make huge changes at once. Make small changes, and stick with them. When those changes become habit, add a few more changes.  If you don't think you're ready to make changes right now, try to pick a date in the future. Make an appointment to see your doctor to discuss whether the time is right for you to start a plan.  Follow-up care is a key part of your treatment and safety. Be sure to make and go to all appointments, and call your doctor if you are having  problems. It's also a good idea to know your test results and keep a list of the medicines you take.  How can you care for yourself at home?  Set realistic goals. Many people expect to lose much more weight than is likely. A weight loss of 5% to 10% of your body weight may be enough to improve your health.  Get family and friends involved to provide support. Talk to them about why you are trying to lose weight, and ask them to help. They can help by participating in exercise and having meals with you, even if they may be eating something different.  Find what works best for you. If you do not have time or do not like to cook, a program that  offers meal replacement bars or shakes may be better for you. Or if you like to prepare meals, finding a plan that includes daily menus and recipes may be best.  Ask your doctor about other health professionals who can help you achieve your weight loss goals.  A dietitian can help you make healthy changes in your diet.  An exercise specialist or personal trainer can help you develop a safe and effective exercise program.  A counselor or psychiatrist can help you cope with issues such as depression, anxiety, or family problems that can make it hard to focus on weight loss.  Consider joining a support group for people who are trying to lose weight. Your doctor can suggest groups in your area.  Where can you learn more?  Go to https://www.bennett.info/ and enter U357 to learn more about "Starting a Weight Loss Plan: Care Instructions."  Current as of: February 28, 2023Content Version: 13.8   2006-2023 Healthwise, Incorporated.   Care instructions adapted under license by [facility]. If you have questions about a medical condition or this instruction, always ask your healthcare professional. [place] any warranty or liability for your use of this information.           A Healthy Heart: Care Instructions  Your Care Instructions      Coronary artery disease, also called heart disease, occurs when a substance called plaque builds up in the vessels that supply oxygen-rich blood to your heart muscle. This can narrow the blood vessels and reduce blood flow. A heart attack happens when blood flow is completely blocked. A high-fat diet, smoking, and other factors increase the risk of heart disease.  Your doctor has found that you have a chance of having heart disease. You can do lots of things to keep your heart healthy. It may not be easy, but you can change your diet, exercise more, and quit smoking. These steps really work to lower your chance of heart disease.  Follow-up care is a key part of your treatment and safety. Be sure to make and go to all appointments, and call your doctor if you are having problems. It's also a good idea to know your test results and keep a list of the medicines you take.  How can you care for yourself at home?  Diet   Use less salt when you cook and eat. This helps lower your blood pressure. Taste food before salting. Add only a little salt when you think you need it. With time, your taste buds will adjust to less salt.    Eat fewer snack items, fast foods, canned soups, and other high-salt, high-fat, processed foods.    Read food labels and try to avoid saturated and trans fats. They increase your risk of heart disease by raising cholesterol levels.    Limit the amount of solid fat-butter, margarine, and shortening-you eat. Use olive, peanut, or canola oil when you cook. Bake, broil, and steam foods instead of frying them.    Eat a variety of fruit and vegetables every day. Dark green, deep orange, red, or yellow fruits and vegetables are especially good for you. Examples include spinach, carrots, peaches, and berries.    Foods high in fiber can reduce your cholesterol and provide important vitamins and minerals. High-fiber foods include whole-grain cereals and breads, oatmeal, beans, brown rice, citrus  fruits, and apples.    Eat lean proteins. Heart-healthy proteins include seafood, lean meats and poultry, eggs, beans, peas, nuts, seeds, and soy  products.    Limit drinks and foods with added sugar. These include candy, desserts, and soda pop.   Lifestyle changes   If your doctor recommends it, get more exercise. Walking is a good choice. Bit by bit, increase the amount you walk every day. Try for at least 30 minutes on most days of the week. You also may want to swim, bike, or do other activities.    Do not smoke. If you need help quitting, talk to your doctor about stop-smoking programs and medicines. These can increase your chances of quitting for good. Quitting smoking may be the most important step you can take to protect your heart. It is never too late to quit.    Limit alcohol to 2 drinks a day for men and 1 drink a day for women. Too much alcohol can cause health problems.    Manage other health problems such as diabetes, high blood pressure, and high cholesterol. If you think you may have a problem with alcohol or drug use, talk to your doctor.   Medicines   Take your medicines exactly as prescribed. Call your doctor if you think you are having a problem with your medicine.    If your doctor recommends aspirin, take the amount directed each day. Make sure you take aspirin and not another kind of pain reliever, such as acetaminophen (Tylenol).   When should you call for help?   Call 911 if you have symptoms of a heart attack. These may include:   Chest pain or pressure, or a strange feeling in the chest.    Sweating.    Shortness of breath.    Pain, pressure, or a strange feeling in the back, neck, jaw, or upper belly or in one or both shoulders or arms.    Lightheadedness or sudden weakness.    A fast or irregular heartbeat.   After you call 911, the operator may tell you to chew 1 adult-strength or 2 to 4 low-dose aspirin. Wait for an ambulance. Do not try to drive yourself.  Watch closely  for changes in your health, and be sure to contact your doctor if you have any problems.  Where can you learn more?  Go to https://www.bennett.info/ and enter F075 to learn more about "A Healthy Heart: Care Instructions."  Current as of: June 25, 2023Content Version: 13.8   2006-2023 Healthwise, Incorporated.   Care instructions adapted under license by [facility]. If you have questions about a medical condition or this instruction, always ask your healthcare professional. [place] any warranty or liability for your use of this information.      Personalized Preventive Plan for Sue Barber - 09/25/2022  Medicare offers a range of preventive health benefits. Some of the tests and screenings are paid in full while other may be subject to a deductible, co-insurance, and/or copay.    Some of these benefits include a comprehensive review of your medical history including lifestyle, illnesses that may run in your family, and various assessments and screenings as appropriate.    After reviewing your medical record and screening and assessments performed today your provider may have ordered immunizations, labs, imaging, and/or referrals for you.  A list of these orders (if applicable) as well as your Preventive Care list are included within your After Visit Summary for your review.    Other Preventive Recommendations:    A preventive eye exam performed by an eye specialist is recommended every 1-2 years to screen for glaucoma;  cataracts, macular degeneration, and other eye disorders.  A preventive dental visit is recommended every 6 months.  Try to get at least 150 minutes of exercise per week or 10,000 steps per day on a pedometer .  Order or download the FREE "Exercise & Physical Activity: Your Everyday Guide" from [employer]. Call [phone] or search [employer] online.  You need 1200-1500 mg of calcium and 1000-2000 IU  of vitamin D per day. It is possible to meet your calcium requirement with diet alone, but a vitamin D supplement is usually necessary to meet this goal.  When exposed to the sun, use a sunscreen that protects against both UVA and UVB radiation with an SPF of 30 or greater. Reapply every 2 to 3 hours or after sweating, drying off with a towel, or swimming.  Always wear a seat belt when traveling in a car. Always wear a helmet when riding a bicycle or motorcycle.

## 2022-09-25 NOTE — Progress Notes (Unsigned)
Vaccine Information Sheet, "Influenza - Inactivated"  given to Sue Barber, or parent/legal guardian of  Sue Barber and verbalized understanding.    Patient responses:    Have you ever had a reaction to a flu vaccine? No  Do you have any current illness?  No  Have you ever had Guillian Barre Syndrome?  No  Do you have a serious allergy to any of the follow: Neomycin, Polymyxin, Thimerosal, eggs or egg products? No    Flu vaccine given per order. Please see immunization tab.    Risks and benefits explained.  Current VIS given.      Immunizations Administered       Name Date Dose Route    Influenza, FLUCELVAX, (age 80 mo+), MDCK, PF, 0.41mL 09/25/2022 0.5 mL Intramuscular    Site: Deltoid- Right    Lot: [id number]    NDC: 09983-382-50

## 2022-09-25 NOTE — Telephone Encounter (Signed)
I sent in a rx for duonebs.  I am not sure what medication her insurance will cover.

## 2022-09-25 NOTE — Telephone Encounter (Signed)
From: Ernest Mallick  To: Mayford Knife  Sent: 09/25/2022 1:44 PM EDT  Subject: Inhaler    The price for the inhaler was 126.00, so I canceled

## 2022-09-25 NOTE — Progress Notes (Unsigned)
Medicare Annual Wellness Visit    Sue Barber is here for Medicare AWV (Discuss gout, inhaler doesn't sem to be effective, discuss wellbutrin)    Assessment & Plan   Initial Medicare annual wellness visit  Chronic bilateral low back pain without sciatica  -     gabapentin (NEURONTIN) 400 MG capsule; Take 1 capsule by mouth 3 times daily for 90 days., Disp-90 capsule, R-2Normal  -     gabapentin (NEURONTIN) 800 MG tablet; Take 1 tablet by mouth 3 times daily for 90 days., Disp-90 tablet, R-2Normal  MDD (major depressive disorder), recurrent episode, moderate (HCC)  -     buPROPion (WELLBUTRIN XL) 150 MG extended release tablet; Take 1 tablet by mouth every morning, Disp-30 tablet, R-0Normal  Chronic obstructive pulmonary disease, unspecified COPD type (HCC)  -     albuterol-ipratropium (COMBIVENT RESPIMAT) 20-100 MCG/ACT AERS inhaler; Inhale 1 puff into the lungs in the morning and 1 puff at noon and 1 puff in the evening and 1 puff before bedtime., Disp-3 each, R-2Normal    Recommendations for Preventive Services Due: see orders and patient instructions/AVS.  Recommended screening schedule for the next 5-10 years is provided to the patient in written form: see Patient Instructions/AVS.     Return in about 3 months (around 12/25/2022) for Pain Med Refill, Depression.     Subjective   The following acute and/or chronic problems were also addressed today:    Chronic Pain Syndrome  She has a hx of chronic left shoulder pain and low back pain.  She has had three injections in her lower back which were ineffective.  She is currently prescribed Zanaflex 2 mg QID, Celebrex 200 mg daily and Gabapentin 1200 mg TID.  She was referred to [facility] 04/24/2022 but will need a new referral since clinic is closing. Per [place], Gabapentin last filled 08/29/2022.     Patient's complete Health Risk Assessment and screening values have been reviewed and are found in Flowsheets. The following problems were reviewed  today and where indicated follow up appointments were made and/or referrals ordered.    Positive Risk Factor Screenings with Interventions:    Fall Risk:  Do you feel unsteady or are you worried about falling? : (!) yes  2 or more falls in past year?: no  Fall with injury in past year?: no     Interventions:    {Fall Interventions:201460083}     Depression:  PHQ-2 Score: 2  PHQ-9 Total Score: 12    Interpretation:   1-4 = minimal  5-9 = mild  10-14 = moderate  15-19 = moderately severe  20-27 = severe  Interventions:  {Depression Interventions:210200097}     Drug Use:          Interpretation:  1-2: Low level - Monitor, re-assess at a later date  3-5: Moderate level - Further Investigation  6-8: Substantial level - Intensive Assessment  9-10: Severe level - Intensive Assessment    Interventions:  {Drug Use Interventions:210200114}        General HRA Questions:  Select all that apply: (!) Loneliness    Loneliness Interventions:  {Loneliness Interventions:201460090}      Social and Emotional Support:  Do you get the social and emotional support that you need?: (!) No    Interventions:  {Social/Emotional SWNIOEVOJJKKX:381829937}    Weight and Activity:  Physical Activity: Inactive (09/25/2022)    Exercise Vital Sign     Days of Exercise per Week: 0 days  Minutes of Exercise per Session: 0 min     On average, how many days per week do you engage in moderate to strenuous exercise (like a brisk walk)?: 0 days  Have you lost any weight without trying in the past 3 months?: No  Body mass index is 53.58 kg/m. (!) Abnormal    Inactivity Interventions:  {Inactivity Interventions:201460115}  Obesity Interventions:  {Obesity Interventions:201460099}    {Optional - Use if Billing for Obesity [phone]}      Dentist Screen:  Have you seen the dentist within the past year?: (!) No    Intervention:  {Dentist Interventions:201460101}    Hearing Screen:  Do you or your family notice any trouble with your hearing that  hasn't been managed with hearing aids?: (!) Yes    Interventions:  {Hearing Interventions:210200102}    Vision Screen:  Do you have difficulty driving, watching TV, or doing any of your daily activities because of your eyesight?: No  Have you had an eye exam within the past year?: (!) No  No results found.    Interventions:   {Vision Interventions:210200101}    Safety:  Do you have any tripping hazards - loose or unsecured carpets or rugs?: (!) Yes  Interventions:  {Interventions:201460090}    ADL's:   Patient reports needing help with:  Select all that apply: (!) Laundry  Interventions:  {Medicare AWV HRA Interventions with see above/meds/referrals:201460090}    Advanced Directives:  Do you have a Living Will?: (!) No    Intervention:  {AWV ADVANCED DIRECTIVE:[phone]}    {OPTIONAL - only use if billing for counseling:[phone]}     {OPTIONAL- LDCT, CVD, STI Counseling Statements:[phone]}                Objective   Vitals:    09/25/22 0935   BP: 126/82   Site: Right Upper Arm   Position: Sitting   Cuff Size: Large Adult   Pulse: 76   Resp: 16   Temp: 97.6 F (36.4 C)   TempSrc: Temporal   SpO2: 92%   Weight: 247 lb 9.6 oz (112.3 kg)   Height: 4\' 9"  (1.448 m)      Body mass index is 53.58 kg/m.    {OPTIONAL - GENERAL PHYSICAL EXAM (WILL AUTO-DELETE IF NOT EGBT):[id number]}       Allergies   Allergen Reactions    Other Rash     "Allergic To Certain Scents In Body Lotions Causing Rash"     Prior to Visit Medications    Medication Sig Taking? Authorizing Provider   albuterol sulfate HFA (PROVENTIL;VENTOLIN;PROAIR) 108 (90 Base) MCG/ACT inhaler Inhale 2 puffs into the lungs every 4 hours as needed for Wheezing Yes Historical Provider, MD   Ascorbic Acid (VITAMIN C) 1000 MG tablet Take 1 tablet by mouth daily Yes Historical Provider, MD   gabapentin (NEURONTIN) 400 MG capsule Take 1 capsule by mouth 3 times daily for 90 days. Yes Mayford Knife, APRN - NP   gabapentin (NEURONTIN) 800 MG tablet Take 1 tablet by  mouth 3 times daily for 90 days. Yes Mayford Knife, APRN - NP   albuterol-ipratropium (COMBIVENT RESPIMAT) 20-100 MCG/ACT AERS inhaler Inhale 1 puff into the lungs in the morning and 1 puff at noon and 1 puff in the evening and 1 puff before bedtime. Yes Mayford Knife, APRN - NP   buPROPion (WELLBUTRIN XL) 150 MG extended release tablet Take 1 tablet by mouth every morning Yes Mayford Knife, APRN - NP  allopurinol (ZYLOPRIM) 300 MG tablet Take 1 tablet by mouth daily Yes Penne Lash, APRN - NP   tiZANidine (ZANAFLEX) 2 MG tablet Take 1 tablet by mouth in the morning and 1 tablet at noon and 1 tablet in the evening. Yes Penne Lash, APRN - NP   amLODIPine-benazepril (LOTREL) 5-20 MG per capsule Take 1 capsule by mouth every morning Yes Penne Lash, APRN - NP   buPROPion (WELLBUTRIN XL) 300 MG extended release tablet Take 1 tablet by mouth every morning Yes Penne Lash, APRN - NP   celecoxib (CELEBREX) 200 MG capsule Take 1 capsule by mouth daily Yes Penne Lash, APRN - NP   Cholecalciferol (VITAMIN D3) 1.25 MG (50000 UT) CAPS Take 1 capsule by mouth daily Yes Historical Provider, MD       CareTeam (Including outside providers/suppliers regularly involved in providing care):   Patient Care Team:  Penne Lash, APRN - NP as PCP - General (Nurse Practitioner)  Penne Lash, APRN - NP as PCP - Empaneled Provider  Deforest Hoyles, MD as Consulting Physician (Cardiology)     Reviewed and updated this visit:  Tobacco  Allergies  Meds  Problems  Med Hx  Surg Hx  Soc Hx  Fam Hx

## 2022-10-01 ENCOUNTER — Telehealth

## 2022-10-01 MED ORDER — NEBULIZER/TUBING/MOUTHPIECE KIT
PACK | Freq: Four times a day (QID) | 0 refills | Status: AC
Start: 2022-10-01 — End: 2023-04-26

## 2022-10-01 NOTE — Telephone Encounter (Signed)
Spoke with client to reassure her that this manager will be looking into her billing concern.Client expressed appreciation.

## 2022-10-01 NOTE — Telephone Encounter (Signed)
Spoke with patient regarding nebulizer solution and equipment.  She states the pharmacy received both but she didn't realize that it was in place of the inhaler.  [facility] stated they cant fill the machine that has to be filled at a medical supply store.  Patient was given [facility] supply store.  Patient is going to have  scripts transferred to [facility].  No further action required at this time.

## 2022-10-01 NOTE — Telephone Encounter (Signed)
Will you please fax order to [facility] on [address]?

## 2022-11-18 ENCOUNTER — Encounter: Payer: MEDICARE | Attending: Critical Care Medicine | Primary: Family

## 2022-11-19 ENCOUNTER — Encounter

## 2022-11-20 MED ORDER — BUPROPION HCL ER (XL) 150 MG PO TB24
150 MG | ORAL_TABLET | Freq: Every morning | ORAL | 11 refills | Status: AC
Start: 2022-11-20 — End: 2022-12-17

## 2022-12-03 ENCOUNTER — Encounter: Admit: 2022-12-03 | Discharge: 2022-12-03 | Payer: MEDICARE | Attending: Critical Care Medicine | Primary: Family

## 2022-12-03 DIAGNOSIS — G4733 Obstructive sleep apnea (adult) (pediatric): Secondary | ICD-10-CM

## 2022-12-03 NOTE — Progress Notes (Signed)
Progress Note    Subjective:   The patient is using Cpap,sleeping well.Fresh in am.No EDS.No snoring.Download not available.  Shortness of breath none.  Chest pain none  Addressing respiratory complaints Patient is negative for  hemoptysis and cyanosis  CONSTITUTIONAL:  negative for fevers and chills      Past Medical History:     has a past medical history of Arthritis, Chronic back pain, Depression, Diverticulosis, Febrile seizure (HCC), History of nuclear stress test, Hyperlipidemia, Hypertension, Obesity, Osteoarthritis, Polyneuropathy, Restless legs, Shortness of breath on exertion, Sleep apnea, and Wears glasses.    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    SOCIAL HISTORY:   reports that she quit smoking about 4 years ago. Her smoking use included cigarettes. She started smoking about 59 years ago. She has a 26.00 pack-year smoking history. She has never used smokeless tobacco. She reports that she does not currently use alcohol. She reports current drug use. Drug: Marijuana Sheran Fava).    Family history:  family history includes Alcohol Abuse in her father; Arthritis in her sister; Diabetes in her brother; Heart Disease in her brother, father, and mother; High Blood Pressure in her father and mother; Mental Illness in her son; Obesity in her mother and sister; Other in her brother, brother, sister, and son; Psoriasis in her sister and son; Stroke in her mother.          Objective:   PHYSICAL EXAM:        VITALS:  Pulse 93   Temp 97.1 F (36.2 C)   Ht 1.461 m (4' 9.5")   Wt 112 kg (247 lb)   SpO2 94%   BMI 52.52 kg/m     24HR INTAKE/OUTPUT:  No intake or output data in the 24 hours ending 12/03/22 1508    CONSTITUTIONAL:  awake, alert, cooperative, no apparent distress, and appears stated age  LUNGS:  clear to  auscultation.   CARDIOVASCULAR:  normal S1 and S2 and negative JVD.  JKK:XFGHWEX soft, non-tender. BS normal. No masses,  No organomegaly  NEURO:Alert and oriented x3. Gait normal. Reflexes and motor strength normal and symmetric. Cranial nerves 2-12 and sensation grossly intact.      DATA:    CBC:  No results for input(s): "WBC", "RBC", "HGB", "HCT", "PLT", "MCV", "MCH", "MCHC", "RDW", "NRBC", "SEGSPCT", "BANDSPCT" in the last 72 hours.   BMP:  No results for input(s): "NA", "K", "CL", "CO2", "BUN", "CREATININE", "CALCIUM", "GLUCOSE" in the last 72 hours.   ABG:  No results for input(s): "PH", "PO2ART", "PCO2ART", "HCO3", "BEART", "O2SAT" in the last 72 hours.  No results found for: "PROBNP", "THEOPH"  No results found for: "CULTRESP"  Radiology Review:  Pertinent images / reports were reviewed as a part of this visit.      Assessment:     Patient Active Problem List   Diagnosis    OSA on CPAP    Vitamin D deficiency    Primary hypertension    Obesity, morbid, BMI 50 or higher (HCC)    Chronic bilateral low back pain without sciatica    Chronic left shoulder pain    Depression    MDD (major depressive disorder), recurrent episode, moderate (HCC)    Pain in both lower legs    Swelling of lower leg    Gout       Plan:  1. Overall the patient is doing well.  2. RTO 4 mths.      Aalijah Mims Daisy Lazar, MD   12/03/2022  3:08 PM

## 2022-12-05 ENCOUNTER — Encounter

## 2022-12-17 ENCOUNTER — Encounter: Admit: 2022-12-17 | Discharge: 2022-12-17 | Payer: MEDICARE | Attending: Family | Primary: Family

## 2022-12-17 DIAGNOSIS — G8929 Other chronic pain: Secondary | ICD-10-CM

## 2022-12-17 MED ORDER — GABAPENTIN 400 MG PO CAPS
400 MG | ORAL_CAPSULE | Freq: Three times a day (TID) | ORAL | 0 refills | Status: DC
Start: 2022-12-17 — End: 2023-03-17

## 2022-12-17 MED ORDER — UNABLE TO FIND
0 refills | Status: DC
Start: 2022-12-17 — End: 2023-04-26

## 2022-12-17 MED ORDER — ALLOPURINOL 300 MG PO TABS
300 MG | ORAL_TABLET | Freq: Every day | ORAL | 3 refills | Status: AC
Start: 2022-12-17 — End: 2023-04-27

## 2022-12-17 MED ORDER — GABAPENTIN 800 MG PO TABS
800 MG | ORAL_TABLET | Freq: Three times a day (TID) | ORAL | 0 refills | Status: AC
Start: 2022-12-17 — End: 2023-03-17

## 2022-12-17 MED ORDER — TIZANIDINE HCL 2 MG PO TABS
2 MG | ORAL_TABLET | Freq: Three times a day (TID) | ORAL | 3 refills | Status: AC
Start: 2022-12-17 — End: 2023-03-17

## 2022-12-17 NOTE — Progress Notes (Signed)
Subjective:      Chief Complaint   Patient presents with    3 Month Follow-Up    Depression    Medication Refill       HPI:  Sue Barber is a 71 y.o. female who presents today for medication follow up. She is planning on transferring care to Dr. Lysle Dingwall.     Chronic Pain Syndrome  She has a hx of chronic left shoulder pain and low back pain.  She has had three injections in her lower back which were ineffective.  She is currently prescribed Zanaflex 2 mg QID, Celebrex 200 mg daily and Gabapentin 1200 mg TID. Per [place], Gabapentin last filled 12/03/2022.  She is requesting a rollater walker to help her maintain mobility.     Depression  She is taking Wellbutrin 150 mg daily and is doing well. She denies AVH/SIB/SI/HI.      Gout  Symptoms are well controlled with current medication.     Past Medical History:   Diagnosis Date    Arthritis     bilateral shoulders, lower back and both knees    Chronic back pain     Depression     Diverticulosis     Febrile seizure (HCC)     History of nuclear stress test 11/27/2015    cardiolite-normal,EF70%    Hyperlipidemia     "Borderline"    Hypertension     Obesity     Osteoarthritis     Polyneuropathy     Restless legs     Shortness of breath on exertion     Sleep apnea     Uses CPAP    Wears glasses         Social History     Tobacco Use    Smoking status: Former     Current packs/day: 0.00     Average packs/day: 0.5 packs/day for 55.2 years (27.6 ttl pk-yrs)     Types: Cigarettes     Start date: 06/19/1963     Quit date: 08/16/2018     Years since quitting: 4.3    Smokeless tobacco: Never   Substance Use Topics    Alcohol use: Not Currently     Comment: "Occ. Maybe Once A Month"        Review of Systems   Constitutional:  Negative for activity change, appetite change, chills, fatigue, fever and unexpected weight change.   HENT:  Negative for congestion, ear pain, hearing loss, sore throat and tinnitus.    Eyes:  Negative for visual disturbance.   Respiratory:  Positive for apnea.  Negative for cough, chest tightness, shortness of breath, wheezing and stridor.         Hx of OSA     Cardiovascular:  Negative for chest pain, palpitations and leg swelling.   Gastrointestinal:  Negative for abdominal pain, constipation, diarrhea, nausea and vomiting.   Musculoskeletal:  Positive for back pain and myalgias. Negative for arthralgias and gait problem.        Hx of chronic pain     Skin: Negative.    Allergic/Immunologic: Positive for environmental allergies.   Neurological:  Negative for dizziness, tremors, seizures, syncope, speech difficulty, weakness and headaches.   Hematological:  Negative for adenopathy.   Psychiatric/Behavioral:  Negative for behavioral problems, confusion, sleep disturbance and suicidal ideas. The patient is not nervous/anxious.            Objective:      BP 105/64 (Site: Right Upper Arm, Position: Sitting,  Cuff Size: Large Adult)   Pulse 71   Temp 97.5 F (36.4 C)   Resp 17   Ht 1.461 m (4' 9.5")   Wt 104.3 kg (230 lb)   SpO2 94%   BMI 48.91 kg/m      Physical Exam  Vitals reviewed.   Constitutional:       General: She is not in acute distress.     Appearance: Normal appearance. She is obese. She is not ill-appearing.   HENT:      Head: Normocephalic.      Right Ear: External ear normal.      Left Ear: External ear normal.      Mouth/Throat:      Mouth: Mucous membranes are moist.      Pharynx: Oropharynx is clear.   Eyes:      Extraocular Movements: Extraocular movements intact.      Conjunctiva/sclera: Conjunctivae normal.      Pupils: Pupils are equal, round, and reactive to light.   Neck:      Vascular: No carotid bruit.   Cardiovascular:      Rate and Rhythm: Normal rate and regular rhythm.      Heart sounds: Normal heart sounds.   Pulmonary:      Effort: Pulmonary effort is normal.      Breath sounds: Normal breath sounds.   Musculoskeletal:         General: Normal range of motion.      Cervical back: Normal range of motion and neck supple.      Right lower  leg: No edema.      Left lower leg: No edema.      Comments: Diffuse joint and back pain- symptoms are well managed at this time.    Skin:     General: Skin is warm and dry.      Capillary Refill: Capillary refill takes less than 2 seconds.   Neurological:      Mental Status: She is alert and oriented to person, place, and time.   Psychiatric:         Mood and Affect: Mood normal.         Behavior: Behavior normal.            Assessment / Plan:      1. Chronic bilateral low back pain without sciatica  Stable, continue current medication.    - gabapentin (NEURONTIN) 800 MG tablet; Take 1 tablet by mouth 3 times daily for 90 days.  Dispense: 270 tablet; Refill: 0  - tiZANidine (ZANAFLEX) 2 MG tablet; Take 1 tablet by mouth in the morning and 1 tablet at noon and 1 tablet in the evening.  Dispense: 270 tablet; Refill: 3  - gabapentin (NEURONTIN) 400 MG capsule; Take 1 capsule by mouth 3 times daily for 90 days.  Dispense: 270 capsule; Refill: 0  - UNABLE TO FIND; Bariatric Rollator Walker  Dispense: 1 each; Refill: 0    2. Gout, unspecified cause, unspecified chronicity, unspecified site  - allopurinol (ZYLOPRIM) 300 MG tablet; Take 1 tablet by mouth daily  Dispense: 90 tablet; Refill: 3    3. Obesity, morbid, BMI 50 or higher ([place])  Continue efforts at regular exercise, healthy diet and weight loss.   - UNABLE TO FIND; Bariatric Rollator Walker  Dispense: 1 each; Refill: 0    4. MDD (major depressive disorder), recurrent episode, moderate (HCC)  Stable. Continue medication.  Patient to call if any concerns or SI before their follow  up appointment.  If she develops suicidal thoughts with a plan, she is instructed to go to emergency room immediately.    Behavioral counseling recommended.       The [employer] ,  was seen with a total time spent of 30 minutes for the visit on this date of service by the E/M provider. The time component had both face to face and non face to face time spent in determining the  total time component.  Counseling and education regarding diagnosis listed and options regarding those diagnoses were also included in determining the time component.         Penne Lash, APRN - NP

## 2023-03-17 ENCOUNTER — Encounter

## 2023-03-17 ENCOUNTER — Ambulatory Visit
Admit: 2023-03-17 | Discharge: 2023-03-17 | Payer: MEDICARE | Attending: Student in an Organized Health Care Education/Training Program | Primary: Student in an Organized Health Care Education/Training Program

## 2023-03-17 DIAGNOSIS — Z7689 Persons encountering health services in other specified circumstances: Secondary | ICD-10-CM

## 2023-03-17 LAB — CBC WITH AUTO DIFFERENTIAL
Basophils %: 1.3 %
Basophils Absolute: 0.1 10*3/uL (ref 0.0–0.2)
Eosinophils %: 2.6 %
Eosinophils Absolute: 0.1 10*3/uL (ref 0.0–0.6)
Hematocrit: 44.9 % (ref 36.0–48.0)
Hemoglobin: 15.3 g/dL (ref 12.0–16.0)
Lymphocytes %: 27.9 %
Lymphocytes Absolute: 1.4 10*3/uL (ref 1.0–5.1)
MCH: 32 pg (ref 26.0–34.0)
MCHC: 34.2 g/dL (ref 31.0–36.0)
MCV: 93.6 fL (ref 80.0–100.0)
MPV: 8.6 fL (ref 5.0–10.5)
Monocytes %: 6.6 %
Monocytes Absolute: 0.3 10*3/uL (ref 0.0–1.3)
Neutrophils %: 61.6 %
Neutrophils Absolute: 3.1 10*3/uL (ref 1.7–7.7)
Platelets: 169 10*3/uL (ref 135–450)
RBC: 4.79 M/uL (ref 4.00–5.20)
RDW: 13.9 % (ref 12.4–15.4)
WBC: 5.1 10*3/uL (ref 4.0–11.0)

## 2023-03-17 MED ORDER — GABAPENTIN 800 MG PO TABS
800 MG | ORAL_TABLET | Freq: Three times a day (TID) | ORAL | 0 refills | Status: DC
Start: 2023-03-17 — End: 2023-06-17

## 2023-03-17 MED ORDER — GABAPENTIN 400 MG PO CAPS
400 MG | ORAL_CAPSULE | Freq: Three times a day (TID) | ORAL | 0 refills | Status: DC
Start: 2023-03-17 — End: 2023-06-17

## 2023-03-17 MED ORDER — MELOXICAM 7.5 MG PO TABS
7.5 MG | ORAL_TABLET | Freq: Every day | ORAL | 2 refills | Status: AC
Start: 2023-03-17 — End: 2023-05-21

## 2023-03-17 MED ORDER — ALBUTEROL SULFATE HFA 108 (90 BASE) MCG/ACT IN AERS
108 | RESPIRATORY_TRACT | 1 refills | Status: DC | PRN
Start: 2023-03-17 — End: 2023-06-24

## 2023-03-17 NOTE — Assessment & Plan Note (Signed)
-  labs drawn

## 2023-03-17 NOTE — Assessment & Plan Note (Signed)
-  referring to Dr. Estevan Ryder per patient request  -Rx for Meloxicam sent to pharmacy.  Continue muscle relaxer as needed.  -PDMP reviewed and appropriate.  Refill for gabapentin sent to pharmacy.

## 2023-03-17 NOTE — Assessment & Plan Note (Signed)
Changes today = none  BP is controlled  Meds: ACEi and calcium channel blocker  Labs: last CMP, lipid panel, and urine microalbumin ordered today.   Recommend lifestyle modifications such as weight loss, exercising for at least 148min/wk, and low sodium/DASH diet.

## 2023-03-17 NOTE — Assessment & Plan Note (Signed)
-  continue using CPAP nightly

## 2023-03-17 NOTE — Progress Notes (Signed)
Subjective     Patient ID: Sue Barber is a 72 y.o. female who presents for Established New Doctor (NTP- former pt of Leigh Joseph Art, APRN-NP) and 3 Month Follow-Up (3 month follow up on chronic conditions and med refills. /NEEDS SOMEONE FOR HER NECK AND BACK- herniated disc lower back. Saw Dr. Gardenia Phlegm before and didn't care for him. Heard good things about Dr. Estevan Ryder. ).     Presents to establish care. PMH of obesity, HTN, chronic pain, depression, and gout.  Followed by Dr. Gardenia Phlegm, ortho, pulmonology    OSA -- uses CPAP every night and followed by pulmonology.     HTN -- currentily on amlodipine 5 and benazipril 20. Denies headache, vision changes, SOB, chest pain, palpitations, or edema.     Depression-- Last PHQ9 in Sept 2023 was 12. Used to be on wellbutrin 300, weaned down to 150, and completely stopped medication since Dec 2023.     Gout -- on allopurinol daily. Gout well controlled with this medication. Hasn't had any flairs in a long time since starting allopurinol.  Has also been trying to make some dietary changes to help reduce flairs.     Chronic pain -- has met with Dr. Gardenia Phlegm, but she would like a second opinion from Dr. Estevan Ryder. Said she had MRI done in [place] before she moved here several years ago.  Was told she had arthritis in Lumbar spine. Does have neuropathy as well, which was diagnosed by nerve study.  Currently on 1200mg  gabapentin TID.          Review of Systems   Constitutional:  Negative for activity change, appetite change and fever.   HENT:  Negative for congestion, rhinorrhea and sore throat.    Eyes:  Negative for visual disturbance.   Respiratory:  Negative for cough, shortness of breath and wheezing.    Cardiovascular:  Negative for chest pain, palpitations and leg swelling.   Gastrointestinal:  Negative for constipation and diarrhea.   Musculoskeletal:  Positive for arthralgias, back pain, neck pain and neck stiffness.   Skin:  Negative for rash.   Neurological:  Positive for numbness.  Negative for headaches.   All other systems reviewed and are negative.       Objective   Vitals:    03/17/23 0958   BP: 108/66   Pulse: 56   Resp: 16   SpO2: 97%       Physical Exam  Vitals and nursing note reviewed.   Constitutional:       General: She is not in acute distress.     Appearance: Normal appearance. She is obese. She is not ill-appearing, toxic-appearing or diaphoretic.   HENT:      Head: Normocephalic and atraumatic.      Nose: Nose normal.      Mouth/Throat:      Mouth: Mucous membranes are moist.      Pharynx: Oropharynx is clear.   Eyes:      Extraocular Movements: Extraocular movements intact.      Conjunctiva/sclera: Conjunctivae normal.   Cardiovascular:      Rate and Rhythm: Normal rate and regular rhythm.      Heart sounds: Normal heart sounds.   Pulmonary:      Breath sounds: Normal breath sounds. No wheezing, rhonchi or rales.   Musculoskeletal:         General: Normal range of motion.      Cervical back: No tenderness.      Right lower leg: No  edema.      Left lower leg: No edema.   Lymphadenopathy:      Cervical: No cervical adenopathy.   Skin:     General: Skin is warm.      Findings: No lesion or rash.   Neurological:      General: No focal deficit present.      Mental Status: She is alert and oriented to person, place, and time. Mental status is at baseline.   Psychiatric:         Mood and Affect: Mood normal.         Behavior: Behavior normal.         Thought Content: Thought content normal.         Judgment: Judgment normal.         Assessment & Plan     Problem List Items Addressed This Visit          Circulatory    Primary hypertension      Changes today = none  BP is controlled  Meds: ACEi and calcium channel blocker  Labs: last CMP, lipid panel, and urine microalbumin ordered today.   Recommend lifestyle modifications such as weight loss, exercising for at least 174min/wk, and low sodium/DASH diet.            Relevant Orders    CBC with Auto Differential    Comprehensive Metabolic  Panel w/ Reflex to MG    Lipid Panel    Microalbumin / Creatinine Urine Ratio       Respiratory    OSA on CPAP      -continue using CPAP nightly            Other    Class 3 severe obesity due to excess calories with serious comorbidity and body mass index (BMI) of 45.0 to 49.9 in adult [facility])      -labs drawn         Relevant Orders    CBC with Auto Differential    Comprehensive Metabolic Panel w/ Reflex to MG    Lipid Panel    Chronic bilateral low back pain without sciatica      -referring to Dr. Estevan Ryder per patient request  -Rx for Meloxicam sent to pharmacy.  Continue muscle relaxer as needed.  -PDMP reviewed and appropriate.  Refill for gabapentin sent to pharmacy.         Relevant Medications    gabapentin (NEURONTIN) 800 MG tablet    gabapentin (NEURONTIN) 400 MG capsule    meloxicam (MOBIC) 7.5 MG tablet    Other Relevant Orders    External Referral To Orthopedic Surgery    Gout      -continue allopurinol  -stable and well controlled         Relevant Medications    meloxicam (MOBIC) 7.5 MG tablet     Other Visit Diagnoses       Encounter to establish care with new doctor    -  Primary    Reviewed previous, office notes, and medical records.    Relevant Orders    CBC with Auto Differential    Comprehensive Metabolic Panel w/ Reflex to MG    Impaired fasting glucose        Relevant Orders    Hemoglobin A1C             Had creamer in coffee.

## 2023-03-17 NOTE — Assessment & Plan Note (Addendum)
-  continue allopurinol  -stable and well controlled

## 2023-03-18 LAB — COMPREHENSIVE METABOLIC PANEL W/ REFLEX TO MG FOR LOW K
ALT: 16 U/L (ref 10–40)
AST: 16 U/L (ref 15–37)
Albumin/Globulin Ratio: 2 (ref 1.1–2.2)
Albumin: 4.3 g/dL (ref 3.4–5.0)
Alkaline Phosphatase: 56 U/L (ref 40–129)
Anion Gap: 13 (ref 3–16)
BUN: 16 mg/dL (ref 7–20)
CO2: 27 mmol/L (ref 21–32)
Calcium: 9.3 mg/dL (ref 8.3–10.6)
Chloride: 103 mmol/L (ref 99–110)
Creatinine: 0.7 mg/dL (ref 0.6–1.2)
Est, Glom Filt Rate: >60 (ref >60)
Glucose: 85 mg/dL (ref 70–99)
Potassium reflex Magnesium: 4.3 mmol/L (ref 3.5–5.1)
Sodium: 143 mmol/L (ref 136–145)
Total Bilirubin: <0.2 mg/dL (ref 0.0–1.0)
Total Protein: 6.4 g/dL (ref 6.4–8.2)

## 2023-03-18 LAB — LIPID PANEL
Cholesterol, Total: 168 mg/dL (ref 0–199)
HDL: 40 mg/dL (ref 40–60)
LDL Calculated: 93 mg/dL (ref <100)
Triglycerides: 173 mg/dL — ABNORMAL HIGH (ref 0–150)
VLDL Cholesterol Calculated: 35 mg/dL

## 2023-03-18 LAB — MICROALBUMIN / CREATININE URINE RATIO
Creatinine, Ur: 126.9 mg/dL (ref 28.0–259.0)
Microalbumin, Random Urine: <1.2 mg/dL (ref <2.0)

## 2023-03-18 LAB — HEMOGLOBIN A1C
Estimated Avg Glucose: 93.9 mg/dL
Hemoglobin A1C: 4.9 %

## 2023-04-07 ENCOUNTER — Encounter
Payer: MEDICARE | Attending: Critical Care Medicine | Primary: Student in an Organized Health Care Education/Training Program

## 2023-04-12 ENCOUNTER — Encounter
Payer: MEDICARE | Attending: Critical Care Medicine | Primary: Student in an Organized Health Care Education/Training Program

## 2023-04-26 ENCOUNTER — Encounter

## 2023-04-26 ENCOUNTER — Ambulatory Visit
Admit: 2023-04-26 | Discharge: 2023-04-26 | Payer: MEDICARE | Attending: Critical Care Medicine | Primary: Student in an Organized Health Care Education/Training Program

## 2023-04-26 DIAGNOSIS — G4733 Obstructive sleep apnea (adult) (pediatric): Secondary | ICD-10-CM

## 2023-04-26 NOTE — Progress Notes (Signed)
Progress Note    Subjective:   The patient is using Cpap,sleeping well.Fresh in am.No EDS.No snoring.Download reviewed.AHI 0.9.  Shortness of breath none.  Chest pain none.  Addressing respiratory complaints Patient is negative for  hemoptysis and cyanosis  CONSTITUTIONAL:  negative for fevers and chills      Past Medical History:     has a past medical history of Arthritis, Chronic back pain, Depression, Diverticulosis, Febrile seizure (HCC), History of nuclear stress test, Hyperlipidemia, Hypertension, Obesity, Osteoarthritis, Polyneuropathy, Restless legs, Shortness of breath on exertion, Sleep apnea, and Wears glasses.    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    SOCIAL HISTORY:   reports that she quit smoking about 4 years ago. Her smoking use included cigarettes. She started smoking about 59 years ago. She has a 27.6 pack-year smoking history. She has never used smokeless tobacco. She reports that she does not currently use alcohol. She reports current drug use. Drug: Marijuana Sheran Fava).    Family history:  family history includes Alcohol Abuse in her father; Arthritis in her sister; Diabetes in her brother; Heart Disease in her brother, father, and mother; High Blood Pressure in her father and mother; Mental Illness in her son; Obesity in her mother and sister; Other in her brother, brother, sister, and son; Psoriasis in her sister and son; Stroke in her mother.          Objective:   PHYSICAL EXAM:        VITALS:  Pulse 76   Temp 97.1 F (36.2 C)   Ht 1.461 m (4' 9.5")   Wt 103.9 kg (229 lb)   SpO2 92%   BMI 48.70 kg/m     24HR INTAKE/OUTPUT:  No intake or output data in the 24 hours ending 04/26/23 1246    CONSTITUTIONAL:  awake, alert, cooperative, no apparent distress, and appears stated age  LUNGS:  clear to  auscultation.   CARDIOVASCULAR:  normal S1 and S2 and negative JVD.  ZOX:WRUEAVW soft, non-tender. BS normal. No masses,  No organomegaly  NEURO:Alert and oriented x3. Gait normal. Reflexes and motor strength normal and symmetric. Cranial nerves 2-12 and sensation grossly intact.      DATA:    CBC:  No results for input(s): "WBC", "RBC", "HGB", "HCT", "PLT", "MCV", "MCH", "MCHC", "RDW", "NRBC", "SEGSPCT", "BANDSPCT" in the last 72 hours.   BMP:  No results for input(s): "NA", "K", "CL", "CO2", "BUN", "CREATININE", "CALCIUM", "GLUCOSE" in the last 72 hours.   ABG:  No results for input(s): "PH", "PO2ART", "PCO2ART", "HCO3", "BEART", "O2SAT" in the last 72 hours.  No results found for: "PROBNP", "THEOPH"  No results found for: "CULTRESP"  Radiology Review:  Pertinent images / reports were reviewed as a part of this visit.      Assessment:     Patient Active Problem List   Diagnosis    OSA on CPAP    Vitamin D deficiency    Primary hypertension    Class 3 severe obesity due to excess calories with serious comorbidity and body mass index (BMI) of 45.0 to 49.9 in adult [facility])    Chronic bilateral low back pain without sciatica    Chronic left shoulder pain    Depression    MDD (major depressive disorder), recurrent episode, moderate (HCC)    Pain in both lower legs    Swelling  of lower leg    Gout       Plan:   1. Overall the patient is doing well.  2. RTO 6 mths.  3. Advised wt reduction.  4. CT chest screening.  Demetries Coia Lorrin Jackson, MD   04/26/2023  12:46 PM

## 2023-04-27 ENCOUNTER — Encounter

## 2023-04-28 MED ORDER — ALLOPURINOL 300 MG PO TABS
300 MG | ORAL_TABLET | Freq: Every day | ORAL | 3 refills | Status: DC
Start: 2023-04-28 — End: 2024-05-15

## 2023-04-28 MED ORDER — CETIRIZINE HCL 10 MG PO TABS
10 MG | ORAL_TABLET | Freq: Every day | ORAL | 1 refills | Status: AC
Start: 2023-04-28 — End: 2023-10-31

## 2023-04-28 NOTE — Telephone Encounter (Signed)
From: [name]  To: Dr. Tonny Branch Curran Lenderman  Sent: 04/27/2023 9:39 PM EDT  Subject: Medicine refills    I need refills for Cetirzine 10 mg please, ,  Not the other one requested, tizanidine

## 2023-05-11 NOTE — Progress Notes (Signed)
Patient's HM shows they are overdue for Mammogram and Colorectal Screening.   Care Everywhere and [employer] [facility] files searched.  HM updated with 2016 colonoscopy.

## 2023-05-21 NOTE — Telephone Encounter (Signed)
Appt 06/17/23  Lv 03/17/23    Reggie from [facility] called requested refill

## 2023-05-25 MED ORDER — MELOXICAM 7.5 MG PO TABS
7.5 MG | ORAL_TABLET | Freq: Every day | ORAL | 0 refills | Status: DC
Start: 2023-05-25 — End: 2023-06-24

## 2023-06-17 ENCOUNTER — Ambulatory Visit
Admit: 2023-06-17 | Discharge: 2023-06-17 | Payer: MEDICARE | Attending: Student in an Organized Health Care Education/Training Program | Primary: Student in an Organized Health Care Education/Training Program

## 2023-06-17 DIAGNOSIS — Z0000 Encounter for general adult medical examination without abnormal findings: Secondary | ICD-10-CM

## 2023-06-17 DIAGNOSIS — I10 Essential (primary) hypertension: Secondary | ICD-10-CM

## 2023-06-17 MED ORDER — BENAZEPRIL HCL 20 MG PO TABS
20 MG | ORAL_TABLET | Freq: Every day | ORAL | 3 refills | Status: AC
Start: 2023-06-17 — End: 2023-09-16

## 2023-06-17 MED ORDER — GABAPENTIN 400 MG PO CAPS
400 MG | ORAL_CAPSULE | Freq: Three times a day (TID) | ORAL | 0 refills | Status: AC
Start: 2023-06-17 — End: 2023-09-15

## 2023-06-17 MED ORDER — GABAPENTIN 800 MG PO TABS
800 MG | ORAL_TABLET | Freq: Three times a day (TID) | ORAL | 0 refills | Status: AC
Start: 2023-06-17 — End: 2023-09-15

## 2023-06-17 NOTE — Telephone Encounter (Signed)
-----   Message from Julieta Bellini Dagami sent at 06/17/2023  1:18 PM EDT -----  Regarding: [facility] Referral Request  [facility] Referral Request    Reason for referral request: Specialty Provider    Specialist/Lab/Test patient is requesting (if known): Orthopedic     Specialist Phone Number (if applicable): [phone]    Additional Information: Patient needs a referral to Dr. Audley Hose an Orthopedic for her spinal and shoulder   --------------------------------------------------------------------------------------------------------------------------    Relationship to Patient: Self     Call Back Information: OK to leave message on voicemail  Preferred Call Back Number: Phone [phone]

## 2023-06-17 NOTE — Progress Notes (Signed)
Medicare Annual Wellness Visit    Sue Barber is here for Medicare AWV    Assessment & Plan   Medicare annual wellness visit, subsequent  Recommendations for Preventive Services Due: see orders and patient instructions/AVS.  Recommended screening schedule for the next 5-10 years is provided to the patient in written form: see Patient Instructions/AVS.     No follow-ups on file.     Subjective       Patient's complete Health Risk Assessment and screening values have been reviewed and are found in Flowsheets. The following problems were reviewed today and where indicated follow up appointments were made and/or referrals ordered.    Positive Risk Factor Screenings with Interventions:          Drug Use:   Substance and Sexual Activity   Drug Use Yes    Frequency: 7.0 times per week    Types: Marijuana Sheran Fava)    Comment: smokes daily or gummies for pain      DAST-10 Score: 1   Interpretation:  1-2: Low level - Monitor, re-assess at a later date  3-5: Moderate level - Further Investigation  6-8: Substantial level - Intensive Assessment  9-10: Severe level - Intensive Assessment  Interventions:  Patient comments: patient states she smokes marijuana or uses gummies daily for pain relief, as well as for recreation, See AVS for additional education material        General HRA Questions:  Select all that apply: (!) New or Increased Pain, New or Increased Fatigue (back/shoulder-saw PCP walker today)    Pain Interventions:  Patient comments: states back and shoulder pain and discussed this with her PCP today prior to AWV. Patient states a walker was ordered for her today at her visit.  See AVS for additional education material    Fatigue Interventions:  Patient comments: states she discussed her fatigue with her PCP today.  See AVS for additional education material      Activity, Diet, and Weight:  On average, how many days per week do you engage in moderate to strenuous exercise (like a brisk walk)?: 3 days  On average, how  many minutes do you engage in exercise at this level?: 10 min    Do you eat balanced/healthy meals regularly?: (!) No    Body mass index is 47.85 kg/m. (!) Abnormal    Do you eat balanced/healthy meals regularly Interventions:  See AVS for additional education material  Obesity Interventions:  See AVS for additional education material            Dentist Screen:  Have you seen the dentist within the past year?: (!) No (needs)    Intervention:  Advised to schedule with their dentist  See AVS for additional education material     Vision Screen:  Do you have difficulty driving, watching TV, or doing any of your daily activities because of your eyesight?: No  Have you had an eye exam within the past year?: (!) No (needs)  No results found.    Interventions:   Patient encouraged to make appointment with their eye specialist  See AVS for additional education material      Advanced Directives:  Do you have a Living Will?: (!) No    Intervention:  has NO advanced directive - information provided                        Objective   Vitals:    06/17/23 1053   BP:  108/64   Pulse: 60   Temp: 97.1 F (36.2 C)   SpO2: 95%   Weight: 102.1 kg (225 lb)   Height: 1.461 m (4' 9.5")      Body mass index is 47.85 kg/m.               No Known Allergies  Prior to Visit Medications    Medication Sig Taking? Authorizing Provider   gabapentin (NEURONTIN) 800 MG tablet Take 1 tablet by mouth 3 times daily for 90 days. Yes Mena Simonis, Tonny Branch, MD   gabapentin (NEURONTIN) 400 MG capsule Take 1 capsule by mouth 3 times daily for 90 days. Yes Bradon Fester, Tonny Branch, MD   tiZANidine (ZANAFLEX) 2 MG tablet TAKE 1 TABLET BY MOUTH IN THE MORNING AND AT NOON AND IN THE EVENING Yes [provider]   benazepril (LOTENSIN) 20 MG tablet Take 1 tablet by mouth daily Yes Lindley Stachnik, Tonny Branch, MD   meloxicam (MOBIC) 7.5 MG tablet Take 1 tablet by mouth daily Yes Katalina Magri, Tonny Branch, MD   allopurinol (ZYLOPRIM) 300 MG tablet Take 1 tablet by mouth daily Yes Jancie Kercher,  Tonny Branch, MD   cetirizine (ZYRTEC) 10 MG tablet Take 1 tablet by mouth daily Yes Lezlie Ritchey, Tonny Branch, MD   turmeric (QC TUMERIC COMPLEX) 500 MG CAPS Take by mouth daily Yes [provider]   albuterol sulfate HFA (PROVENTIL;VENTOLIN;PROAIR) 108 (90 Base) MCG/ACT inhaler Inhale 2 puffs into the lungs every 4 hours as needed for Wheezing Yes Phillip Sandler, Tonny Branch, MD   Ascorbic Acid (VITAMIN C) 1000 MG tablet Take 1 tablet by mouth daily Yes [provider]   amLODIPine-benazepril (LOTREL) 5-20 MG per capsule Take 1 capsule by mouth every morning Yes Swank, Soledad Gerlach, APRN - NP   Cholecalciferol (VITAMIN D3) 1.25 MG (50000 UT) CAPS Take 1 capsule by mouth daily Yes [provider]       CareTeam (Including outside providers/suppliers regularly involved in providing care):   Patient Care Team:  Earlie Counts, MD as PCP - General (Family Medicine)  Lysle Dingwall, Tonny Branch, MD as PCP - Empaneled Provider  Deforest Hoyles, MD as Consulting Physician (Cardiology)     Reviewed and updated this visit:  Tobacco  Allergies  Meds  Med Hx  Surg Hx  Soc Hx  Fam Hx      I, Rene Kocher, LPN, 03/04/6577, performed the documented evaluation under the direct supervision of the attending physician.        This encounter was performed under my, Earlie Counts, MD's, direct supervision, 06/17/2023.

## 2023-06-17 NOTE — Patient Instructions (Signed)
Substance Use Disorder: Care Instructions  Overview     You can improve your life and health by stopping your use of alcohol or drugs. When you don't drink or use drugs, you may feel and sleep better. You may get along better with your family, friends, and coworkers. There are medicines and programs that can help with substance use disorder.  How can you care for yourself at home?  Here are some ways to help you stay sober and prevent relapse.  If you have been given medicine to help keep you sober or reduce your cravings, be sure to take it exactly as prescribed.  Talk to your doctor about programs that can help you stop using drugs or drinking alcohol.  Do not keep alcohol or drugs in your home.  Plan ahead. Think about what you'll say if other people ask you to drink or use drugs. Try not to spend time with people who drink or use drugs.  Use the time and money spent on drinking or drugs to do something that's important to you.  Preventing a relapse  Have a plan to deal with relapse. Learn to recognize changes in your thinking that lead you to drink or use drugs. Get help before you start to drink or use drugs again.  Try to stay away from situations, friends, or places that may lead you to drink or use drugs.  If you feel the need to drink alcohol or use drugs again, seek help right away. Call a trusted friend or family member. Some people get support from organizations such as Narcotics Anonymous or [facility] or from treatment facilities.  If you relapse, get help as soon as you can. Some people make a plan with another person that outlines what they want that person to do for them if they relapse. The plan usually includes how to handle the relapse and who to notify in case of relapse.  Don't give up. Remember that a relapse doesn't mean that you have failed. Use the experience to learn the triggers that lead you to drink or use drugs. Then quit again. Recovery is a lifelong process. Many people have  several relapses before they are able to quit for good.  Follow-up care is a key part of your treatment and safety. Be sure to make and go to all appointments, and call your doctor if you are having problems. It's also a good idea to know your test results and keep a list of the medicines you take.  When should you call for help?   Call 911  anytime you think you may need emergency care. For example, call if you or someone else:   Has overdosed or has withdrawal signs. Be sure to tell the emergency workers that you are or someone else is using or trying to quit using drugs. Overdose or withdrawal signs may include:  Losing consciousness.  Seizure.  Seeing or hearing things that aren't there (hallucinations).    Is thinking or talking about suicide or harming others.   Where to get help 24 hours a day, 7 days a week   If you or someone you know talks about suicide, self-harm, a mental health crisis, a substance use crisis, or any other kind of emotional distress, get help right away. You can:   Call the Suicide and Crisis Lifeline at 23.    Call 1-800-273-TALK 540-277-7569).    Text HOME to (978)199-7640 to access the Crisis Text Line.   Consider saving  these numbers in your phone.  Go to 988lifeline.org for more information or to chat online.  Call your doctor now or seek immediate medical care if:   You are having withdrawal symptoms. These may include nausea or vomiting, sweating, shakiness, and anxiety.   Watch closely for changes in your health, and be sure to contact your doctor if:   You have a relapse.    You need more help or support to stop.   Where can you learn more?  Go to [employer] and enter H573 to learn more about "Substance Use Disorder: Care Instructions."  Current as of: November 11, 2022  Content Version: 14.1   2006-2024 [employer], Incorporated.   Care instructions adapted under license by [facility]. If you have questions about a medical condition or this  instruction, always ask your healthcare professional. Healthwise, Incorporated disclaims any warranty or liability for your use of this information.           Fatigue: Care Instructions  Overview     Fatigue is a feeling of tiredness, exhaustion, or lack of energy. You may feel fatigue because of too much or not enough activity. It can also come from stress, lack of sleep, boredom, and poor diet. Many medical problems, such as viral infections, can cause fatigue. Emotional problems, especially depression, are often the cause of fatigue.  Fatigue is most often a symptom of another problem. Treatment for fatigue depends on the cause. For example, if you have fatigue because you have a certain health problem, treating this problem also treats your fatigue. If depression or anxiety is the cause, treatment may help.  Follow-up care is a key part of your treatment and safety. Be sure to make and go to all appointments, and call your doctor if you are having problems. It's also a good idea to know your test results and keep a list of the medicines you take.  How can you care for yourself at home?  Get regular exercise. But try not to overdo it. It may help to go back and forth between rest and exercise.  Get plenty of rest.  Eat a variety of healthy foods. Try not to skip any meals.  Avoid or try to cut back on your use of caffeine, tobacco, and alcohol. Caffeine is most often found in coffee, tea, cola drinks, and energy drinks.  Limit medicines that can cause fatigue. These include medicines such as cold and allergy medicines.  When should you call for help?  Watch closely for changes in your health, and be sure to contact your doctor if:   You have new symptoms such as fever or a rash.    Your fatigue gets worse.    You have been feeling down, depressed, or hopeless. Or you may have lost interest in things that you usually enjoy.    You are not getting better as expected.   Where can you learn more?  Go to  [employer] and enter W864 to learn more about "Fatigue: Care Instructions."  Current as of: June 20, 2022  Content Version: 14.1   2006-2024 [employer], Inc[employer].   Care instructions adapted under license by [facility]. If you have questions about a medical condition or this instruction, always ask your healthcare professional. Healthwise, Incorporated disclaims any warranty or liability for your use of this information.           Learning About Dental Care for Older Adults  Dental care for older adults: Overview  Dental care  for older people is much the same as for younger adults. But older adults do have concerns that younger adults do not. Older adults may have problems with gum disease and decay on the roots of their teeth. They may need missing teeth replaced or broken fillings fixed. Or they may have dentures that need to be cared for. Some older adults may have trouble holding a toothbrush.  You can help remind the person you are caring for to brush and floss their teeth or to clean their dentures. In some cases, you may need to do the brushing and other dental care tasks. People who have trouble using their hands or who have dementia may need this extra help.  How can you help with dental care?  Normal dental care  To keep the teeth and gums healthy:  Brush the teeth with fluoride toothpaste twice a day--in the morning and at night--and floss at least once a day. Plaque can quickly build up on the teeth of older adults.  Watch for the signs of gum disease. These signs include gums that bleed after brushing or after eating hard foods, such as apples.  See a dentist regularly. Many experts recommend checkups every 6 months.  Keep the dentist up to date on any new medications the person is taking.  Encourage a balanced diet that includes whole grains, vegetables, and fruits, and that is low in saturated fat and sodium.  Encourage the person you're caring for not to use tobacco  products. They can affect dental and general health.  Many older adults have a fixed income and feel that they can't afford dental care. But most towns and cities have programs in which dentists help older adults by lowering fees. Contact your area's public health offices or social services for information about dental care in your area.  Using a toothbrush  Older adults with arthritis sometimes have trouble brushing their teeth because they can't easily hold the toothbrush. Their hands and fingers may be stiff, painful, or weak. If this is the case, you can:  Offer an Mining engineer toothbrush.  Enlarge the handle of a non-electric toothbrush by wrapping a sponge, an elastic bandage, or adhesive tape around it.  Push the toothbrush handle through a ball made of rubber or soft foam.  Make the handle longer and thicker by taping Popsicle sticks or tongue depressors to it.  You may also be able to buy special toothbrushes, toothpaste dispensers, and floss holders.  Your doctor may recommend a soft-bristle toothbrush if the person you care for bleeds easily. Bleeding can happen because of a health problem or from certain medicines.  A toothpaste for sensitive teeth may help if the person you care for has sensitive teeth.  How do you brush and floss someone's teeth?  If the person you are caring for has a hard time cleaning their teeth on their own, you may need to brush and floss their teeth for them. It may be easiest to have the person sit and face away from you, and to sit or stand behind them. That way you can steady their head against your arm as you reach around to floss and brush their teeth. Choose a place that has good lighting and is comfortable for both of you.  Before you begin, gather your supplies. You will need gloves, floss, a toothbrush, and a container to hold water if you are not near a sink. Wash and dry your hands well and put on gloves. Start by  flossing:  Gently work a piece of floss between each of  the teeth toward the gums. A plastic flossing tool may make this easier, and they are available at most drugstores.  Curve the floss around each tooth into a U-shape and gently slide it under the gum line.  Move the floss firmly up and down several times to scrape off the plaque.  After you've finished flossing, throw away the used floss and begin brushing:  Wet the brush and apply toothpaste.  Place the brush at a 45-degree angle where the teeth meet the gums. Press firmly, and move the brush in small circles over the surface of the teeth.  Be careful not to brush too hard. Vigorous brushing can make the gums pull away from the teeth and can scratch the tooth enamel.  Brush all surfaces of the teeth, on the tongue side and on the cheek side. Pay special attention to the front teeth and all surfaces of the back teeth.  Brush chewing surfaces with short back-and-forth strokes.  After you've finished, help the person rinse the remaining toothpaste from their mouth.  Where can you learn more?  Go to [employer] and enter F944 to learn more about "Learning About Dental Care for Older Adults."  Current as of: August 02, 2022  Content Version: 14.1   2006-2024 Healthwise, Incorporated.   Care instructions adapted under license by [facility]. If you have questions about a medical condition or this instruction, always ask your healthcare professional. Healthwise, Incorporated disclaims any warranty or liability for your use of this information.           Learning About Vision Tests  What are vision tests?     The four most common vision tests are visual acuity tests, refraction, visual field tests, and color vision tests.  Visual acuity (sharpness) tests  These tests are used:  To see if you need glasses or contact lenses.  To monitor an eye problem.  To check an eye injury.  Visual acuity tests are done as part of routine exams. You may also have this test when you get your driver's license or  apply for some types of jobs.  Visual field tests  These tests are used:  To check for vision loss in any area of your range of vision.  To screen for certain eye diseases.  To look for nerve damage after a stroke, head injury, or other problem that could reduce blood flow to the brain.  Refraction and color tests  A refraction test is done to find the right prescription for glasses and contact lenses.  A color vision test is done to check for color blindness.  Color vision is often tested as part of a routine exam. You may also have this test when you apply for a job where recognizing different colors is important, such as truck driving, Optician, dispensing, or the [employer].  How are vision tests done?  Visual acuity test   You cover one eye at a time.  You read aloud from a wall chart across the room.  You read aloud from a small card that you hold in your hand.  Refraction   You look into a special device.  The device puts lenses of different strengths in front of each eye to see how strong your glasses or contact lenses need to be.  Visual field tests   Your doctor may have you look through special machines.  Or your doctor may simply have you stare  straight ahead while they move a finger into and out of your field of vision.  Color vision test   You look at pieces of printed test patterns in various colors. You say what number or symbol you see.  Your doctor may have you trace the number or symbol using a pointer.  How do these tests feel?  There is very little chance of having a problem from this test. If dilating drops are used for a vision test, they may make the eyes sting and cause a medicine taste in the mouth.  Follow-up care is a key part of your treatment and safety. Be sure to make and go to all appointments, and call your doctor if you are having problems. It's also a good idea to know your test results and keep a list of the medicines you take.  Where can you learn more?  Go to  [employer] and enter G551 to learn more about "Learning About Vision Tests."  Current as of: June 01, 2022  Content Version: 14.1   2006-2024 Healthwise, Incorporated.   Care instructions adapted under license by [facility]. If you have questions about a medical condition or this instruction, always ask your healthcare professional. Healthwise, Incorporated disclaims any warranty or liability for your use of this information.           Advance Directives: Care Instructions  Overview  An advance directive is a legal way to state your wishes at the end of your life. It tells your family and your doctor what to do if you can't say what you want.  There are two main types of advance directives. You can change them any time your wishes change.  Living will.  This form tells your family and your doctor your wishes about life support and other treatment. The form is also called a declaration.  Medical power of attorney.  This form lets you name a person to make treatment decisions for you when you can't speak for yourself. This person is called a health care agent (health care proxy, health care surrogate). The form is also called a durable power of attorney for health care.  If you do not have an advance directive, decisions about your medical care may be made by a family member, or by a doctor or a judge who doesn't know you.  It may help to think of an advance directive as a gift to the people who care for you. If you have one, they won't have to make tough decisions by themselves.  For more information, including forms for your state, see the [employer] website ([employer].com.cy).  Follow-up care is a key part of your treatment and safety. Be sure to make and go to all appointments, and call your doctor if you are having problems. It's also a good idea to know your test results and keep a list of the medicines you take.  What should you include in an advance  directive?  Many states have a unique advance directive form. (It may ask you to address specific issues.) Or you might use a universal form that's approved by many states.  If your form doesn't tell you what to address, it may be hard to know what to include in your advance directive. Use the questions below to help you get started.  Who do you want to make decisions about your medical care if you are not able to?  What life-support measures do you want if you have a serious illness  that gets worse over time or can't be cured?  What are you most afraid of that might happen? (Maybe you're afraid of having pain, losing your independence, or being kept alive by machines.)  Where would you prefer to die? (Your home? A hospital? A nursing home?)  Do you want to donate your organs when you die?  Do you want certain religious practices performed before you die?  When should you call for help?  Be sure to contact your doctor if you have any questions.  Where can you learn more?  Go to [employer] and enter R264 to learn more about "Advance Directives: Care Instructions."  Current as of: November 12, 2022  Content Version: 14.1   2006-2024 Healthwise, Incorporated.   Care instructions adapted under license by [facility]. If you have questions about a medical condition or this instruction, always ask your healthcare professional. Healthwise, Incorporated disclaims any warranty or liability for your use of this information.           Starting a Weight Loss Plan: Care Instructions  Overview     It can be a challenge to lose weight. But your doctor can help you make a weight-loss plan that meets your needs.  You don't have to make a lot of big changes at once. A better idea might be to focus on small changes and stick with them. When those changes become habit, you can add a few more changes.  Some people find it helpful to take an exercise or nutrition class. If you have questions, ask your doctor  about seeing a registered dietitian or an exercise specialist. You might also think about joining a weight-loss support group.  If you're not ready to make changes right now, try to pick a date in the future. Then make an appointment with your doctor to talk about when and how you'll get started with a plan.  Follow-up care is a key part of your treatment and safety. Be sure to make and go to all appointments, and call your doctor if you are having problems. It's also a good idea to know your test results and keep a list of the medicines you take.  How can you care for yourself at home?  Set realistic goals. Many people expect to lose much more weight than is likely. A weight loss of 5% to 10% of your body weight may be enough to improve your health.  Get family and friends involved to provide support. Talk to them about why you are trying to lose weight, and ask them to help. They can help by participating in exercise and having meals with you, even if they may be eating something different.  Find what works best for you. If you do not have time or do not like to cook, a program that offers meal replacement bars or shakes may be better for you. Or if you like to prepare meals, finding a plan that includes daily menus and recipes may be best.  Ask your doctor about other health professionals who can help you achieve your weight loss goals.  A dietitian can help you make healthy changes in your diet.  An exercise specialist or personal trainer can help you develop a safe and effective exercise program.  A counselor or psychiatrist can help you cope with issues such as depression, anxiety, or family problems that can make it hard to focus on weight loss.  Consider joining a support group for people who are trying to  lose weight. Your doctor can suggest groups in your area.  Where can you learn more?  Go to [employer] and enter U357 to learn more about "Starting a Weight Loss Plan: Care  Instructions."  Current as of: September 16, 2022  Content Version: 14.1   2006-2024 Healthwise, Incorporated.   Care instructions adapted under license by [facility]. If you have questions about a medical condition or this instruction, always ask your healthcare professional. Healthwise, Incorporated disclaims any warranty or liability for your use of this information.           Learning About Lung Cancer Screening  What is screening for lung cancer?     Lung cancer screening is a way to find some lung cancers early, before a person has any symptoms of the cancer.  Lung cancer screening may help those who have the highest risk for lung cancer--people age 85 and older who are or were heavy smokers. For most people, who aren't at increased risk, screening for lung cancer probably isn't helpful.  Screening won't prevent cancer. And it may not find all lung cancers. Lung cancer screening may lower the risk of dying from lung cancer in a small number of people.  How is it done?  Lung cancer screening is done with a low-dose CT (computed tomography) scan. A CT scan uses X-rays, or radiation, to make detailed pictures of your body. Experts recommend that screening be done in medical centers that focus on finding and treating lung cancer.  Who is screening recommended for?  Lung cancer screening is recommended for people age 68 and older who are or were heavy smokers. That means people with a smoking history of at least 20 pack years. A pack year is a way to measure how heavy a smoker you are or were.  To figure out your pack years, multiply how many packs a day on average (assuming 20 cigarettes per pack) you have smoked by how many years you have smoked. For example:  If you smoked 1 pack a day for 20 years, that's 1 times 20. So you have a smoking history of 20 pack years.  If you smoked 2 packs a day for 10 years, that's 2 times 10. So you have a smoking history of 20 pack years.  Experts agree that screening is for  people who have a high risk of lung cancer. But experts don't agree on what high risk means. Some say people age 45 or older with at least a 20-pack-year smoking history are high risk. Others say it's people age 66 or older with a 30-pack-year history.  To see if you could benefit from screening, first find out if you are at high risk for lung cancer. Your doctor can help you decide your lung cancer risk.  What are the risks of screening?  CT screening for lung cancer isn't perfect. It can show an abnormal result when it turns out there wasn't any cancer. This is called a false-positive result. This means you may need more tests to make sure you don't have cancer. These tests can be harmful and cause a lot of worry.  These tests may include more CT scans and invasive testing like a lung biopsy. In a biopsy, the doctor takes a sample of tissue from inside your lung so it can be looked at under a microscope. A biopsy is the only way to tell if you have lung cancer. If the biopsy finds cancer, you and your doctor will have  to decide how or whether to treat it.  Some lung cancers found on CT scans are harmless and would not have caused a problem if they had not been found through screening. But because doctors can't tell which ones will turn out to be harmless, most will be treated. This means that you may get treatment--including surgery, radiation, or chemotherapy--that you don't need.  There is a risk of damage to cells or tissue from being exposed to radiation, including the small amounts used in CTs, X-rays, and other medical tests. Over time, exposure to radiation may cause cancer and other health problems. But in most cases, the risk of getting cancer from being exposed to small amounts of radiation is low. It's not a reason to avoid these tests for most people.  What are the benefits of screening?  Your scan may be normal (negative).  For some people who are at higher risk, screening lowers the chance of dying  of lung cancer. How much and how long you smoked helps to determine your risk level. Screening can find some cancers early, when treatment may be more likely to work.  What happens after screening?  The results of your CT scan will be sent to your doctor. Someone from your care team will explain the results of your scan and answer any questions you may have. If you need any follow-up, he or she will help you understand what to do next.  After a lung cancer screening, you can go back to your usual activities right away.  A lung cancer screening test can't tell if you have lung cancer. If your results are positive, your doctor can't tell whether an abnormal finding is a harmless nodule, cancer, or something else without doing more tests.  What can you do to help prevent lung cancer?  Some lung cancers can't be prevented. But if you smoke, quitting smoking is the best step you can take to prevent lung cancer. If you want to quit, your doctor can recommend medicines or other ways to help.  Follow-up care is a key part of your treatment and safety. Be sure to make and go to all appointments, and call your doctor if you are having problems. It's also a good idea to know your test results and keep a list of the medicines you take.  Where can you learn more?  Go to [employer] and enter Q940 to learn more about "Learning About Lung Cancer Screening."  Current as of: October 21, 2022  Content Version: 14.1   2006-2024 [employer].   Care instructions adapted under license by [facility]. If you have questions about a medical condition or this instruction, always ask your healthcare professional. [employer] disclaims any warranty or liability for your use of this information.           A Healthy Heart: Care Instructions  Overview     Coronary artery disease, also called heart disease, occurs when a substance called plaque builds up in the vessels that supply oxygen-rich  blood to your heart muscle. This can narrow the blood vessels and reduce blood flow. A heart attack happens when blood flow is completely blocked. A high-fat diet, smoking, and other factors increase the risk of heart disease.  Your doctor has found that you have a chance of having heart disease. A heart-healthy lifestyle can help keep your heart healthy and prevent heart disease. This lifestyle includes eating healthy, being active, staying at a weight that's healthy for you, and  not smoking or using tobacco. It also includes taking medicines as directed, managing other health conditions, and trying to get a healthy amount of sleep.  Follow-up care is a key part of your treatment and safety. Be sure to make and go to all appointments, and call your doctor if you are having problems. It's also a good idea to know your test results and keep a list of the medicines you take.  How can you care for yourself at home?  Diet   Use less salt when you cook and eat. This helps lower your blood pressure. Taste food before salting. Add only a little salt when you think you need it. With time, your taste buds will adjust to less salt.    Eat fewer snack items, fast foods, canned soups, and other high-salt, high-fat, processed foods.    Read food labels and try to avoid saturated and trans fats. They increase your risk of heart disease by raising cholesterol levels.    Limit the amount of solid fat--butter, margarine, and shortening--you eat. Use olive, peanut, or canola oil when you cook. Bake, broil, and steam foods instead of frying them.    Eat a variety of fruit and vegetables every day. Dark green, deep orange, red, or yellow fruits and vegetables are especially good for you. Examples include spinach, carrots, peaches, and berries.    Foods high in fiber can reduce your cholesterol and provide important vitamins and minerals. High-fiber foods include whole-grain cereals and breads, oatmeal, beans, brown rice, citrus  fruits, and apples.    Eat lean proteins. Heart-healthy proteins include seafood, lean meats and poultry, eggs, beans, peas, nuts, seeds, and soy products.    Limit drinks and foods with added sugar. These include candy, desserts, and soda pop.   Heart-healthy lifestyle   If your doctor recommends it, get more exercise. For many people, walking is a good choice. Or you may want to swim, bike, or do other activities. Bit by bit, increase the time you're active every day. Try for at least 30 minutes on most days of the week.    Try to quit or cut back on using tobacco and other nicotine products. This includes smoking and vaping. If you need help quitting, talk to your doctor about stop-smoking programs and medicines. These can increase your chances of quitting for good. Quitting is one of the most important things you can do to protect your heart. It is never too late to quit. Try to avoid secondhand smoke too.    Stay at a weight that's healthy for you. Talk to your doctor if you need help losing weight.    Try to get 7 to 9 hours of sleep each night.    Limit alcohol to 2 drinks a day for men and 1 drink a day for women. Too much alcohol can cause health problems.    Manage other health problems such as diabetes, high blood pressure, and high cholesterol. If you think you may have a problem with alcohol or drug use, talk to your doctor.   Medicines   Take your medicines exactly as prescribed. Call your doctor if you think you are having a problem with your medicine.    If your doctor recommends aspirin, take the amount directed each day. Make sure you take aspirin and not another kind of pain reliever, such as acetaminophen (Tylenol).   When should you call for help?   Call 911 if you have symptoms of a heart  attack. These may include:   Chest pain or pressure, or a strange feeling in the chest.    Sweating.    Shortness of breath.    Pain, pressure, or a strange feeling in the back, neck, jaw, or  upper belly or in one or both shoulders or arms.    Lightheadedness or sudden weakness.    A fast or irregular heartbeat.   After you call 911, the operator may tell you to chew 1 adult-strength or 2 to 4 low-dose aspirin. Wait for an ambulance. Do not try to drive yourself.  Watch closely for changes in your health, and be sure to contact your doctor if you have any problems.  Where can you learn more?  Go to [employer] and enter F075 to learn more about "A Healthy Heart: Care Instructions."  Current as of: June 20, 2022  Content Version: 14.1   2006-2024 [employer].   Care instructions adapted under license by [facility]. If you have questions about a medical condition or this instruction, always ask your healthcare professional. [employer], [employer] disclaims any warranty or liability for your use of this information.      Personalized Preventive Plan for Sue Barber - 06/17/2023  Medicare offers a range of preventive health benefits. Some of the tests and screenings are paid in full while other may be subject to a deductible, co-insurance, and/or copay.    Some of these benefits include a comprehensive review of your medical history including lifestyle, illnesses that may run in your family, and various assessments and screenings as appropriate.    After reviewing your medical record and screening and assessments performed today your provider may have ordered immunizations, labs, imaging, and/or referrals for you.  A list of these orders (if applicable) as well as your Preventive Care list are included within your After Visit Summary for your review.    Other Preventive Recommendations:    A preventive eye exam performed by an eye specialist is recommended every 1-2 years to screen for glaucoma; cataracts, macular degeneration, and other eye disorders.  A preventive dental visit is recommended every 6 months.  Try to get at least 150 minutes of exercise per week  or 10,000 steps per day on a pedometer .  Order or download the FREE "Exercise & Physical Activity: Your Everyday Guide" from [employer]. Call 320-875-8073 or search [employer] online.  You need 1200-1500 mg of calcium and 1000-2000 IU of vitamin D per day. It is possible to meet your calcium requirement with diet alone, but a vitamin D supplement is usually necessary to meet this goal.  When exposed to the sun, use a sunscreen that protects against both UVA and UVB radiation with an SPF of 30 or greater. Reapply every 2 to 3 hours or after sweating, drying off with a towel, or swimming.  Always wear a seat belt when traveling in a car. Always wear a helmet when riding a bicycle or motorcycle.

## 2023-06-17 NOTE — Assessment & Plan Note (Signed)
Sue Barber was evaluated today and a DME order was entered for a wheeled walker with seat because she requires this to successfully complete daily living tasks of personal cares, ambulating, and grooming.  A wheeled walker with seat is necessary due to the patient's unsteady gait, upper body weakness, inability to pick up and ambulation device, ambulating only short distances by pushing a walker, and the need to sit for a short time before resuming ambulation.  These tasks cannot be completed with a lesser ambulation device such as a cane, crutch, or standard walker.  The need for this equipment was discussed with the patient and she understands and is in agreement.

## 2023-06-17 NOTE — Progress Notes (Signed)
Subjective     Patient ID: Sue Barber is a 72 y.o. female who presents for 3 Month Follow-Up (3 month follow up for med refill. Needs script for wheeled walker with seat- [employer] /Neuropathy in feet is getting worse and arthritis in left shoulder. Last shoulder injection was November./Can she take her muscle relaxer 4 times a day?).     HTN -- Currently on amlodipine 5 and benazipril 20. Patient does not check BP at home. Has had a couple episodes of dizziness/light-headedness.  Denies headache, vision changes, SOB, chest pain, palpitations, or edema.     Neuropathy/chronic back pain -- Currently taking gabapentin, which only seems to give minimal pain relief, but still having lots of pain and seeming to get worse.  Was referred to Dr. Luther Redo at previous visit, but he does not accept her insurance.  Declines going to pain mangament because she feels like the back injections are painful and do not actually give any pain relief. Would like to have a wheeled walker to help her be able to ambulate with back and leg pain. Has much difficulty ambulating on her own without a walker.  Can only walk for short periods of time prior to needing to stop to take a rest due to pain.              Review of Systems   Constitutional:  Negative for activity change, appetite change and fever.   HENT:  Negative for congestion, rhinorrhea and sore throat.    Eyes:  Negative for visual disturbance.   Respiratory:  Negative for cough, shortness of breath and wheezing.    Cardiovascular:  Negative for chest pain, palpitations and leg swelling.   Gastrointestinal:  Negative for constipation and diarrhea.   Musculoskeletal:  Positive for arthralgias, back pain and gait problem.   Skin:  Negative for rash.   Neurological:  Negative for headaches.   All other systems reviewed and are negative.       Objective   Vitals:    06/17/23 1002   BP: 108/64   Pulse: 60   Resp: 16   SpO2: 95%       Physical Exam  Vitals and nursing note reviewed.    Constitutional:       General: She is not in acute distress.     Appearance: Normal appearance. She is obese. She is not ill-appearing, toxic-appearing or diaphoretic.   HENT:      Head: Normocephalic and atraumatic.      Nose: Nose normal.      Mouth/Throat:      Mouth: Mucous membranes are moist.      Pharynx: Oropharynx is clear.   Eyes:      Extraocular Movements: Extraocular movements intact.      Conjunctiva/sclera: Conjunctivae normal.   Cardiovascular:      Rate and Rhythm: Normal rate and regular rhythm.      Heart sounds: Normal heart sounds.   Pulmonary:      Breath sounds: Normal breath sounds. No wheezing, rhonchi or rales.   Musculoskeletal:         General: Normal range of motion.      Cervical back: No tenderness.      Right lower leg: No edema.      Left lower leg: No edema.   Lymphadenopathy:      Cervical: No cervical adenopathy.   Skin:     General: Skin is warm.      Findings: No lesion or rash.  Neurological:      General: No focal deficit present.      Mental Status: She is alert and oriented to person, place, and time. Mental status is at baseline.      Gait: Gait normal.   Psychiatric:         Mood and Affect: Mood normal.         Behavior: Behavior normal.         Thought Content: Thought content normal.         Judgment: Judgment normal.         Assessment & Plan     Problem List Items Addressed This Visit       Primary hypertension - Primary      Changes today = discontinuing amlodipine 5  BP is controlled and has hypotensive symptoms  Meds: ACEi and calcium channel blocker  Labs: last CMP, lipid panel, and urine microalbumin on March 2024.   Recommend lifestyle modifications such as weight loss, exercising for at least 147min/wk, and low sodium/DASH diet.            Relevant Medications    benazepril (LOTENSIN) 20 MG tablet    Chronic bilateral low back pain without sciatica     -referring to Dr. Everett Graff (ortho)  -continue gabapentin and meloxicam         Relevant Medications     gabapentin (NEURONTIN) 800 MG tablet    gabapentin (NEURONTIN) 400 MG capsule    tiZANidine (ZANAFLEX) 2 MG tablet    Other Relevant Orders    DME Order for Walker as OP    External Referral To Orthopedic Surgery    Pain in both lower legs    Relevant Orders    DME Order for Walker as OP    Neuropathy     -PDMP reviewed and appropriate. No drug seeking behavior  -refills of gabapentin sent to pharmacy  -unable to go to Dr. Luther Redo for insurance issues, so referring to Dr. Everett Graff instead         Relevant Orders    DME Order for Dan Humphreys as OP    External Referral To Orthopedic Surgery    Dependent on walker for ambulation     Fajr Fife was evaluated today and a DME order was entered for a wheeled walker with seat because she requires this to successfully complete daily living tasks of personal cares, ambulating, and grooming.  A wheeled walker with seat is necessary due to the patient's unsteady gait, upper body weakness, inability to pick up and ambulation device, ambulating only short distances by pushing a walker, and the need to sit for a short time before resuming ambulation.  These tasks cannot be completed with a lesser ambulation device such as a cane, crutch, or standard walker.  The need for this equipment was discussed with the patient and she understands and is in agreement.             Relevant Orders    DME Order for Dan Humphreys as OP     Other Visit Diagnoses       Asymptomatic menopausal state        Relevant Orders    DEXA BONE DENSITY AXIAL SKELETON    Encounter for screening mammogram for malignant neoplasm of breast        Relevant Orders    MAM Screening Bilateral

## 2023-06-17 NOTE — Assessment & Plan Note (Signed)
-  PDMP reviewed and appropriate. No drug seeking behavior  -refills of gabapentin sent to pharmacy  -unable to go to Dr. Luther Redo for insurance issues, so referring to Dr. Everett Graff instead

## 2023-06-17 NOTE — Assessment & Plan Note (Signed)
Changes today = discontinuing amlodipine 5  BP is controlled and has hypotensive symptoms  Meds: ACEi and calcium channel blocker  Labs: last CMP, lipid panel, and urine microalbumin on March 2024.   Recommend lifestyle modifications such as weight loss, exercising for at least 143min/wk, and low sodium/DASH diet.

## 2023-06-17 NOTE — Assessment & Plan Note (Signed)
-  referring to Dr. Everett Graff (ortho)  -continue gabapentin and meloxicam

## 2023-06-17 NOTE — Telephone Encounter (Signed)
Please advise 

## 2023-06-24 MED ORDER — MELOXICAM 7.5 MG PO TABS
7.5 MG | ORAL_TABLET | Freq: Every day | ORAL | 0 refills | Status: AC
Start: 2023-06-24 — End: 2023-07-23

## 2023-06-24 MED ORDER — ALBUTEROL SULFATE HFA 108 (90 BASE) MCG/ACT IN AERS
10890 (90 Base) MCG/ACT | RESPIRATORY_TRACT | 1 refills | Status: AC | PRN
Start: 2023-06-24 — End: 2023-09-16

## 2023-07-26 MED ORDER — MELOXICAM 7.5 MG PO TABS
7.5 | ORAL_TABLET | Freq: Every day | ORAL | 0 refills | Status: DC
Start: 2023-07-26 — End: 2023-07-29

## 2023-07-28 ENCOUNTER — Encounter

## 2023-07-28 ENCOUNTER — Inpatient Hospital Stay
Admit: 2023-07-28 | Payer: MEDICARE | Attending: Critical Care Medicine | Primary: Student in an Organized Health Care Education/Training Program

## 2023-07-28 ENCOUNTER — Inpatient Hospital Stay
Admit: 2023-07-28 | Payer: MEDICARE | Attending: Student in an Organized Health Care Education/Training Program | Primary: Student in an Organized Health Care Education/Training Program

## 2023-07-28 DIAGNOSIS — Z1231 Encounter for screening mammogram for malignant neoplasm of breast: Secondary | ICD-10-CM

## 2023-07-28 DIAGNOSIS — Z780 Asymptomatic menopausal state: Secondary | ICD-10-CM

## 2023-07-28 DIAGNOSIS — Z87891 Personal history of nicotine dependence: Secondary | ICD-10-CM

## 2023-07-29 ENCOUNTER — Encounter

## 2023-07-29 MED ORDER — MELOXICAM 7.5 MG PO TABS
7.5 MG | ORAL_TABLET | Freq: Every day | ORAL | 1 refills | Status: AC
Start: 2023-07-29 — End: 2023-12-16

## 2023-07-29 MED ORDER — ALENDRONATE SODIUM 70 MG PO TABS
70 | ORAL_TABLET | ORAL | 1 refills | Status: DC
Start: 2023-07-29 — End: 2024-11-01

## 2023-08-09 NOTE — Telephone Encounter (Signed)
error 

## 2023-08-25 MED ORDER — AZITHROMYCIN 250 MG PO TABS
250 | ORAL_TABLET | ORAL | 0 refills | Status: AC
Start: 2023-08-25 — End: 2023-09-04

## 2023-08-25 NOTE — Telephone Encounter (Signed)
 Patient called about seeing something in Mychart that she was to have another one. Dr. Beatris with patient and wanted patient to have Zpack use as directed x 0 refills and an appointment in one month. I e-scribed to [facility]. [place] [place]..DR RANGINWALA APPROVED THIS MEDICATION ORDER/REFILL.

## 2023-08-26 MED ORDER — TIZANIDINE HCL 2 MG PO TABS
2 | ORAL_TABLET | Freq: Three times a day (TID) | ORAL | 1 refills | Status: DC | PRN
Start: 2023-08-26 — End: 2023-12-16

## 2023-08-26 NOTE — Telephone Encounter (Signed)
 LV-6/20  NA-9/19    WG S L

## 2023-09-16 ENCOUNTER — Ambulatory Visit
Admit: 2023-09-16 | Discharge: 2023-09-16 | Payer: MEDICARE | Attending: Student in an Organized Health Care Education/Training Program | Primary: Student in an Organized Health Care Education/Training Program

## 2023-09-16 DIAGNOSIS — G8929 Other chronic pain: Secondary | ICD-10-CM

## 2023-09-16 MED ORDER — GABAPENTIN (ONCE-DAILY) 900 MG PO TABS
900 | ORAL_TABLET | Freq: Three times a day (TID) | ORAL | 1 refills | Status: AC | PRN
Start: 2023-09-16 — End: ?

## 2023-09-16 MED ORDER — ALBUTEROL SULFATE HFA 108 (90 BASE) MCG/ACT IN AERS
108 (90 Base) MCG/ACT | RESPIRATORY_TRACT | 1 refills | Status: DC | PRN
Start: 2023-09-16 — End: 2024-04-06

## 2023-09-16 MED ORDER — FLUCONAZOLE 150 MG PO TABS
150 | ORAL_TABLET | Freq: Once | ORAL | 0 refills | Status: AC
Start: 2023-09-16 — End: 2023-09-16

## 2023-09-16 MED ORDER — BENAZEPRIL HCL 20 MG PO TABS
20 MG | ORAL_TABLET | Freq: Every day | ORAL | 1 refills | Status: DC
Start: 2023-09-16 — End: 2023-10-13

## 2023-09-16 NOTE — Assessment & Plan Note (Signed)
-  contnue following with Dr. Lenette  -s/p PT  -refill for gabapentin . Patient reports that she was taking a total of 1200mg  TID, but per PDMP hadn't been receiving that high of dose of medication for over a year.   -will increase to 900mg  TID, but want to hold off on upping the 800mg  to 1200mg  TID.

## 2023-09-16 NOTE — Progress Notes (Signed)
 Subjective     Patient ID: Sue Barber is a 72 y.o. female who presents for 3 Month Follow-Up and Medication Check (Questions about fosamax . Questions on benzapril as well; if staying on it switch to 90 day supply).     HTN -- Currently on benzaperil. Patient does check BP at home.  Denies headache, vision changes, SOB, chest pain, palpitations, or edema.     Depression -- stable off of meds    Neuropathy and chronic back pain -- currenlty on gabapentin . Followed by Dr. Lenette. Going to hold off on back injections fo rnow and wants to avoid surgery if possible. Did really well with PT. Says that she takes 1200mg  gabapentin  TID, but per chart review has only been getting 800mg  TID. Last time she was prescribed higher dose was in Sept 2023.     Osteoporosis -- started on fosamax , vitD and Ca, but says she has not taken fosamax  yet since wanted to further discuss with me first.          Review of Systems   Constitutional:  Negative for activity change, appetite change and fever.   HENT:  Negative for congestion, rhinorrhea and sore throat.    Eyes:  Negative for visual disturbance.   Respiratory:  Negative for cough, shortness of breath and wheezing.    Cardiovascular:  Negative for chest pain, palpitations and leg swelling.   Gastrointestinal:  Negative for constipation and diarrhea.   Musculoskeletal:  Positive for back pain.   Skin:  Negative for rash.   Neurological:  Negative for headaches.   All other systems reviewed and are negative.       Objective   Vitals:    09/16/23 1130   BP: 124/72   Pulse: 68   SpO2: 99%       Physical Exam  Vitals and nursing note reviewed.   Constitutional:       General: She is not in acute distress.     Appearance: Normal appearance. She is obese. She is not ill-appearing, toxic-appearing or diaphoretic.   HENT:      Head: Normocephalic and atraumatic.      Nose: Nose normal.      Mouth/Throat:      Mouth: Mucous membranes are moist.      Pharynx: Oropharynx is clear.   Eyes:       Extraocular Movements: Extraocular movements intact.      Conjunctiva/sclera: Conjunctivae normal.   Cardiovascular:      Rate and Rhythm: Normal rate and regular rhythm.      Heart sounds: Normal heart sounds.   Pulmonary:      Breath sounds: Normal breath sounds. No wheezing, rhonchi or rales.   Musculoskeletal:         General: Normal range of motion.      Cervical back: No tenderness.      Right lower leg: No edema.      Left lower leg: No edema.   Lymphadenopathy:      Cervical: No cervical adenopathy.   Skin:     General: Skin is warm.      Findings: No lesion or rash.   Neurological:      General: No focal deficit present.      Mental Status: She is alert and oriented to person, place, and time. Mental status is at baseline.   Psychiatric:         Mood and Affect: Mood normal.         Behavior:  Behavior normal.         Thought Content: Thought content normal.         Judgment: Judgment normal.         Assessment & Plan     Problem List Items Addressed This Visit       Primary hypertension      Changes today = none  BP is controlled  Meds: ACEi  Labs: last CMP, lipid panel, and urine microalbumin on March 2024.   Recommend lifestyle modifications such as weight loss, exercising for at least 155min/wk, and low sodium/DASH diet.            Relevant Medications    benazepril  (LOTENSIN ) 20 MG tablet    Chronic bilateral low back pain without sciatica - Primary     -contnue following with Dr. Lenette  -s/p PT  -refill for gabapentin . Patient reports that she was taking a total of 1200mg  TID, but per PDMP hadn't been receiving that high of dose of medication for over a year.   -will increase to 900mg  TID, but want to hold off on upping the 800mg  to 1200mg  TID.          Relevant Medications    Gabapentin , Once-Daily, 900 MG TABS    Osteoporosis     -discussed risks vs benefits and side effects of fosamax . Patient expressed understanding and is agreeable to starting medication.   -start fosamax  weekly and continue  vitamin D and calcium daily.   -recommend weight bearing exercises          Other Visit Diagnoses       Vaginal discharge        Relevant Medications    fluconazole  (DIFLUCAN ) 150 MG tablet

## 2023-09-16 NOTE — Assessment & Plan Note (Signed)
Changes today = none  BP is controlled  Meds: ACEi  Labs: last CMP, lipid panel, and urine microalbumin on March 2024.   Recommend lifestyle modifications such as weight loss, exercising for at least 168min/wk, and low sodium/DASH diet.

## 2023-09-16 NOTE — Assessment & Plan Note (Signed)
-  discussed risks vs benefits and side effects of fosamax. Patient expressed understanding and is agreeable to starting medication.   -start fosamax weekly and continue vitamin D and calcium daily.   -recommend weight bearing exercises

## 2023-09-17 ENCOUNTER — Encounter

## 2023-09-23 ENCOUNTER — Ambulatory Visit
Admit: 2023-09-23 | Discharge: 2023-09-23 | Payer: MEDICARE | Attending: Critical Care Medicine | Primary: Student in an Organized Health Care Education/Training Program

## 2023-09-23 VITALS — HR 81 | Temp 96.90000°F | Ht <= 58 in | Wt 225.0 lb

## 2023-09-23 DIAGNOSIS — J189 Pneumonia, unspecified organism: Secondary | ICD-10-CM

## 2023-09-23 MED ORDER — AZITHROMYCIN 250 MG PO TABS
250 | ORAL_TABLET | ORAL | 0 refills | Status: DC
Start: 2023-09-23 — End: 2024-11-01

## 2023-09-23 NOTE — Progress Notes (Signed)
 Progress Note    Subjective:   The patient is using Cpap,sleeping well.Fresh in am.No EDS.No snoring.Download reviewed.AHI 1.0.CT chest:Lingular infiltrate.  Shortness of breath none.  Chest pain none.  Addressing respiratory complaints Patient is negative for  hemoptysis and cyanosis  CONSTITUTIONAL:  negative for fevers and chills      Past Medical History:     has a past medical history of Arthritis, Chronic back pain, Depression, Diverticulosis, Febrile seizure (HCC), History of nuclear stress test, Hyperlipidemia, Hypertension, Obesity, Osteoarthritis, Polyneuropathy, Restless legs, Shortness of breath on exertion, Sleep apnea, and Wears glasses.    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    SOCIAL HISTORY:   reports that she quit smoking about 5 years ago. Her smoking use included cigarettes. She started smoking about 60 years ago. She has a 27.6 pack-year smoking history. She has never used smokeless tobacco. She reports that she does not currently use alcohol. She reports current drug use. Frequency: 7.00 times per week. Drug: Marijuana Oda).    Family history:  family history includes Alcohol Abuse in her father; Anemia in her brother, sister, and sister; Arthritis in her sister; Breast Cancer (age of onset: 16) in her sister; Cirrhosis in her sister; Diabetes in her brother; Heart Disease in her brother, father, and mother; High Blood Pressure in her father and mother; Mental Illness in her son; Obesity in her mother and sister; Osteoporosis in her sister; Other in her brother, brother, sister, sister, and son; Psoriasis in her son; Stroke in her mother.          Objective:   PHYSICAL EXAM:        VITALS:  Pulse 81   Temp 96.9 F (36.1 C)   Ht 1.461 m (4' 9.5)   Wt 102.1 kg (225 lb)   SpO2 92%   BMI 47.85  kg/m     24HR INTAKE/OUTPUT:  No intake or output data in the 24 hours ending 09/23/23 1224    CONSTITUTIONAL:  awake, alert, cooperative, no apparent distress, and appears stated age  LUNGS:  clear to auscultation.   CARDIOVASCULAR:  normal S1 and S2 and negative JVD.  JAI:Jainfzw soft, non-tender. BS normal. No masses,  No organomegaly  NEURO:Alert and oriented x3. Gait normal. Reflexes and motor strength normal and symmetric. Cranial nerves 2-12 and sensation grossly intact.      DATA:    CBC:  No results for input(s): WBC, RBC, HGB, HCT, PLT, MCV, MCH, MCHC, RDW, NRBC, BANDSPCT in the last 72 hours.    Invalid input(s): SEGSPCT   BMP:  No results for input(s): NA, K, CL, CO2, BUN, CREATININE, CALCIUM, GLUCOSE in the last 72 hours.   ABG:  No results for input(s): PH, PO2ART, PCO2ART, HCO3, BEART, O2SAT in the last 72 hours.  No results found for: PROBNP, THEOPH  No results found for: CULTRESP  Radiology Review:  Pertinent images / reports were reviewed as a part of this visit.      Assessment:     Patient Active Problem List   Diagnosis    OSA on CPAP    Vitamin D deficiency    Primary hypertension    Class 3 severe obesity due to excess calories with serious comorbidity and body mass index (BMI) of 45.0 to 49.9 in adult [facility])    Chronic bilateral low back pain without  sciatica    Chronic left shoulder pain    Depression    MDD (major depressive disorder), recurrent episode, moderate (HCC)    Pain in both lower legs    Swelling of lower leg    Gout    Neuropathy    Dependent on walker for ambulation    Osteoporosis       Plan:   1. Zpack.  2. RTO 1 mth.  3. Advised wt reduction.  4. CT chest in 3 wks.  Rosanne Wohlfarth Cinderella Lao, MD   09/23/2023  12:24 PM

## 2023-09-28 MED ORDER — GABAPENTIN 800 MG PO TABS
800 | ORAL_TABLET | Freq: Three times a day (TID) | ORAL | 0 refills | Status: DC
Start: 2023-09-28 — End: 2023-12-16

## 2023-09-28 MED ORDER — GABAPENTIN 300 MG PO CAPS
300 | ORAL_CAPSULE | Freq: Three times a day (TID) | ORAL | 2 refills | Status: DC
Start: 2023-09-28 — End: 2023-09-28

## 2023-09-28 NOTE — Addendum Note (Signed)
 Addended by: Earlie Counts on: 09/28/2023 09:47 AM     Modules accepted: Orders

## 2023-10-11 ENCOUNTER — Inpatient Hospital Stay
Admit: 2023-10-11 | Payer: MEDICARE | Attending: Critical Care Medicine | Primary: Student in an Organized Health Care Education/Training Program

## 2023-10-11 DIAGNOSIS — J189 Pneumonia, unspecified organism: Secondary | ICD-10-CM

## 2023-10-13 ENCOUNTER — Encounter

## 2023-10-14 MED ORDER — BENAZEPRIL HCL 20 MG PO TABS
20 MG | ORAL_TABLET | Freq: Every day | ORAL | 1 refills | Status: DC
Start: 2023-10-14 — End: 2024-04-06

## 2023-10-26 ENCOUNTER — Encounter
Admit: 2023-10-26 | Discharge: 2023-10-26 | Payer: MEDICARE | Attending: Critical Care Medicine | Primary: Student in an Organized Health Care Education/Training Program

## 2023-10-26 DIAGNOSIS — J9811 Atelectasis: Secondary | ICD-10-CM

## 2023-10-26 NOTE — Progress Notes (Signed)
Progress Note    Subjective:   The patient is using Cpap,sleeping well.Fresh in am.No EDS.No snoring.Download reviewed.AHI 1.0.CT chest:Minimal atelectasis.  Shortness of breath none.  Chest pain none.  Addressing respiratory complaints Patient is negative for  hemoptysis and cyanosis  CONSTITUTIONAL:  negative for fevers and chills      Past Medical History:     has a past medical history of Arthritis, Chronic back pain, Depression, Diverticulosis, Febrile seizure (HCC), History of nuclear stress test, Hyperlipidemia, Hypertension, Obesity, Osteoarthritis, Polyneuropathy, Restless legs, Shortness of breath on exertion, Sleep apnea, and Wears glasses.    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    SOCIAL HISTORY:   reports that she quit smoking about 5 years ago. Her smoking use included cigarettes. She started smoking about 60 years ago. She has a 27.6 pack-year smoking history. She has never used smokeless tobacco. She reports that she does not currently use alcohol. She reports current drug use. Frequency: 7.00 times per week. Drug: Marijuana Sheran Fava).    Family history:  family history includes Alcohol Abuse in her father; Anemia in her brother, sister, and sister; Arthritis in her sister; Breast Cancer (age of onset: 49) in her sister; Cirrhosis in her sister; Diabetes in her brother; Heart Disease in her brother, father, and mother; High Blood Pressure in her father and mother; Mental Illness in her son; Obesity in her mother and sister; Osteoporosis in her sister; Other in her brother, brother, sister, sister, and son; Psoriasis in her son; Stroke in her mother.          Objective:   PHYSICAL EXAM:        VITALS:  Pulse 76   Temp 96.9 F (36.1 C)   Resp 12   Ht 1.461 m (4' 9.5")   Wt 102.1 kg (225 lb)   SpO2 90%    BMI 47.85 kg/m     24HR INTAKE/OUTPUT:  No intake or output data in the 24 hours ending 10/26/23 1355    CONSTITUTIONAL:  awake, alert, cooperative, no apparent distress, and appears stated age  LUNGS:  clear to auscultation.   CARDIOVASCULAR:  normal S1 and S2 and negative JVD.  WUX:LKGMWNU soft, non-tender. BS normal. No masses,  No organomegaly  NEURO:Alert and oriented x3. Gait normal. Reflexes and motor strength normal and symmetric. Cranial nerves 2-12 and sensation grossly intact.      DATA:    CBC:  No results for input(s): "WBC", "RBC", "HGB", "HCT", "PLT", "MCV", "MCH", "MCHC", "RDW", "NRBC", "BANDSPCT" in the last 72 hours.    Invalid input(s): "SEGSPCT"   BMP:  No results for input(s): "NA", "K", "CL", "CO2", "BUN", "CREATININE", "CALCIUM", "GLUCOSE" in the last 72 hours.   ABG:  No results for input(s): "PH", "PO2ART", "PCO2ART", "HCO3", "BEART", "O2SAT" in the last 72 hours.  No results found for: "PROBNP", "THEOPH"  No results found for: "CULTRESP"  Radiology Review:  Pertinent images / reports were reviewed as a part of this visit.      Assessment:     Patient Active Problem List   Diagnosis    OSA on CPAP    Vitamin D deficiency    Primary hypertension    Class 3 severe obesity due to excess calories with serious comorbidity and body mass index (BMI) of 45.0 to 49.9 in adult    Chronic bilateral low  back pain without sciatica    Chronic left shoulder pain    Depression    MDD (major depressive disorder), recurrent episode, moderate (HCC)    Pain in both lower legs    Swelling of lower leg    Gout    Neuropathy    Dependent on walker for ambulation    Osteoporosis       Plan:   1. Overall doing well.  2. RTO 3 mths.  3. Advised wt reduction.  4. CT chest in 3 mths.  5. Flu shot.  Angelette Ganus Lorrin Jackson, MD   10/26/2023  1:55 PM

## 2023-11-01 MED ORDER — CETIRIZINE HCL 10 MG PO TABS
10 | ORAL_TABLET | Freq: Every day | ORAL | 1 refills | Status: DC
Start: 2023-11-01 — End: 2024-05-03

## 2023-11-08 ENCOUNTER — Ambulatory Visit
Admit: 2023-11-08 | Discharge: 2023-11-08 | Payer: MEDICARE | Attending: Physician Assistant | Primary: Student in an Organized Health Care Education/Training Program

## 2023-11-08 VITALS — BP 106/76 | HR 94 | Ht <= 58 in | Wt 219.4 lb

## 2023-11-08 DIAGNOSIS — K5900 Constipation, unspecified: Secondary | ICD-10-CM

## 2023-11-08 MED ORDER — KETOROLAC TROMETHAMINE 60 MG/2ML IM SOLN
60 | Freq: Once | INTRAMUSCULAR | Status: AC
Start: 2023-11-08 — End: 2023-11-08
  Administered 2023-11-08: 20:00:00 60 mg via INTRAMUSCULAR

## 2023-11-08 MED ORDER — METHYLPREDNISOLONE 4 MG PO TBPK
4 MG | PACK | ORAL | 0 refills | Status: AC
Start: 2023-11-08 — End: 2023-12-16

## 2023-11-08 NOTE — Patient Instructions (Addendum)
Obtain Xrays  Drink at least 64 ounces of water daily  Drink 8 ounces of prune juice daily  Increase dietary fiber to 20-25 grams per day or supplement with Citrucel over the counter  Colace (stool softener) 100 mg twice daily  Miralax 17 grams three times daily until BM      Apply a heating pad off and on  Star Medrol Dose pak  Increase Tizanidine to 4 mg three times daily as needed  Increase Meloxicam to 15 mg (two tablets) once daily with food

## 2023-11-08 NOTE — Progress Notes (Unsigned)
11/08/2023    [name]    Chief Complaint   Patient presents with    Constipation     For about 3 weeks. Pt took some OTC with little relief       HPI  History was obtained from patient.   Sue Barber is a 72 y.o. female who presents today with complaints of 3 week hx of       2 weeks  Low back pai, wore when sitting straght up, worse when going from sitting to standing.  Heating pad helps.    Musce relaxers, took 2 by mistake and that helped.     Acute on chronic low back pain. Dr. Luther Redo.  Injection have not helped.  Saw him one month ago.  Thisis tightness      Irrgeular Bms  Sat morning loose stools, before that was marbes.        Doculax    Increase tizaidine to 4 mg prn  Meloxicam increase to 15 mg once a day    REVIEW OF SYMPTOMS    Constitutional:  Denies fever, chills, weight loss or weakness  Eyes:  Denies photophobia or discharge  ENT:  Denies sore throat or ear pain  Cardiovascular:  Denies chest pain, palpitations or swelling  Respiratory:  Denies cough or shortness of breath  GI:  Denies abdominal pain, nausea, vomiting, or diarrhea  Musculoskeletal:  Denies back pain  Skin:  No rashes  Neurologic:  Denies headache, focal weakness, or sensory changes  Endocrine:  Denies polyuria or polydipsia  Lymphatic:  Denies swollen glands  Psychiatric:  Denies depression, suicidal ideation or homicidal ideation      PAST MEDICAL HISTORY  Past Medical History:   Diagnosis Date    Arthritis     bilateral shoulders, lower back and both knees    Chronic back pain     Depression     Diverticulosis     Febrile seizure (HCC)     History of nuclear stress test 11/27/2015    cardiolite-normal,EF70%    Hyperlipidemia     "Borderline"    Hypertension     Obesity     Osteoarthritis     Polyneuropathy     Restless legs     Shortness of breath on exertion     Sleep apnea     Uses CPAP    Wears glasses        FAMILY HISTORY  Family History   Problem Relation Age of Onset    Heart Disease Mother     High Blood Pressure Mother      Stroke Mother     Obesity Mother     Heart Disease Father     High Blood Pressure Father     Alcohol Abuse Father     Breast Cancer Sister 54    Other Sister         "Iron Deficiency"    Anemia Sister     Other Sister         psoriatic arthritis    Anemia Sister     Arthritis Sister     Obesity Sister     Osteoporosis Sister     Cirrhosis Sister     Anemia Brother     Other Brother         Iron Deficiency    Diabetes Brother     Heart Disease Brother         "Bypass Surgery"    Other Brother         "  Hepatitis C"    Other Son         Sleep Apnea    Mental Illness Son         PTSD    Psoriasis Son     Ovarian Cancer Neg Hx        SOCIAL HISTORY  Social History     Socioeconomic History    Marital status: Divorced   Tobacco Use    Smoking status: Former     Current packs/day: 0.00     Average packs/day: 0.5 packs/day for 55.2 years (27.6 ttl pk-yrs)     Types: Cigarettes     Start date: 06/19/1963     Quit date: 08/16/2018     Years since quitting: 5.2    Smokeless tobacco: Never   Vaping Use    Vaping status: Never Used   Substance and Sexual Activity    Alcohol use: Not Currently     Comment: "Occ. Maybe Once A Month"    Drug use: Yes     Frequency: 7.0 times per week     Types: Marijuana Sheran Fava)     Comment: smokes daily or gummies for pain    Sexual activity: Never     Partners: Male     Social Determinants of Health     Financial Resource Strain: Low Risk  (04/24/2022)    Overall Financial Resource Strain (CARDIA)     Difficulty of Paying Living Expenses: Not very hard   Transportation Needs: Unknown (04/24/2022)    PRAPARE - Transportation     Lack of Transportation (Non-Medical): No   Physical Activity: Insufficiently Active (06/17/2023)    Exercise Vital Sign     Days of Exercise per Week: 3 days     Minutes of Exercise per Session: 10 min   Intimate Partner Violence: Unknown (04/21/2022)    Humiliation, Afraid, Rape, and Kick questionnaire     Fear of Current or Ex-Partner: No   Housing Stability: Unknown  (04/24/2022)    Housing Stability Vital Sign     Unstable Housing in the Last Year: No        SURGICAL HISTORY  Past Surgical History:   Procedure Laterality Date    APPENDECTOMY  1999    [place] During Cholecystectomy    CHOLECYSTECTOMY  1999    Appendectomy    COLONOSCOPY  2001    "Found 2 Polyps"    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    JOINT REPLACEMENT Left 02/2010    Total Left Knee    TUBAL LIGATION  1978       CURRENT MEDICATIONS  Current Outpatient Medications   Medication Sig Dispense Refill    methylPREDNISolone (MEDROL, PAK,) 4 MG tablet Use as directed 1 kit 0    cetirizine (ZYRTEC) 10 MG tablet Take 1 tablet by mouth daily 90 tablet 1    benazepril (LOTENSIN) 20 MG tablet Take 1 tablet by mouth daily 90 tablet 1    gabapentin (NEURONTIN) 800 MG tablet Take 1 tablet by mouth 3 times daily for 90 days. 270 tablet 0    albuterol sulfate HFA (PROVENTIL;VENTOLIN;PROAIR) 108 (90 Base) MCG/ACT inhaler Inhale 2 puffs into the lungs every 4 hours as needed for Wheezing 18 g 1    tiZANidine (ZANAFLEX) 2 MG tablet Take 1 tablet by mouth every 8 hours as needed (muscle spasm) 270 tablet 1    alendronate (FOSAMAX) 70 MG tablet Take 1 tablet by mouth every 7 days  13 tablet 1    meloxicam (MOBIC) 7.5 MG tablet Take 1 tablet by mouth daily 90 tablet 1    turmeric (QC TUMERIC COMPLEX) 500 MG CAPS Take by mouth daily      Ascorbic Acid (VITAMIN C) 1000 MG tablet Take 1 tablet by mouth daily      Cholecalciferol (VITAMIN D3) 1.25 MG (50000 UT) CAPS Take 1 capsule by mouth daily      azithromycin (ZITHROMAX Z-PAK) 250 MG tablet 2 tabs day 1, 1 tab day 2 to 5 (Patient not taking: Reported on 11/08/2023) 6 tablet 0    allopurinol (ZYLOPRIM) 300 MG tablet Take 1 tablet by mouth daily (Patient not taking: Reported on 11/08/2023) 90 tablet 3     No current facility-administered medications for this visit.       ALLERGIES  No Known Allergies    PHYSICAL EXAM    BP 106/76 (Site: Right Upper Arm, Position: Sitting,  Cuff Size: Large Adult)   Pulse 94   Ht 1.461 m (4' 9.5")   Wt 99.5 kg (219 lb 6.4 oz)   SpO2 97%   BMI 46.66 kg/m     Constitutional:  Well developed, well nourished.  No acute distress.  HENT:  Normocephalic, atraumatic, bilateral external ears normal, bilateral ear canals and Tms normal, oropharynx moist, nose normal  Eyes:  PERRLA, EOMI, conjunctiva normal, no discharge, no scleral icterus  Neck:  Normal range of motion, no tenderness, supple, no palpable thyromegaly or thyroid nodules, no carotid bruits noted  Lymphatic:  No lymphadenopathy noted  Cardiovascular:  Normal heart rate, normal rhythm, no murmurs, gallops or rubs  Thorax & Lungs:  Normal breath sounds, no respiratory distress, no wheezing, no rales, no rhonchi  Abdomen:  Soft, no tenderness to palpation.  No palpable mass or organomegaly.  No distention.  Skin:  Warm, dry, no erythema, no rash  Back:  No tenderness, No CVA tenderness  Extremities:  No edema, no tenderness, no cyanosis  Musculoskeletal:  Good range of motion  all major joints, no tenderness to palpation or major deformities noted  Neurologic:  Alert & oriented X 3, normal motor function, normal sensory function, no focal deficits noted  Psychiatric:  Affect normal, mood normal    ASSESSMENT & PLAN    Sierria was seen today for constipation.    Diagnoses and all orders for this visit:    Constipation, unspecified constipation type  -     XR ABDOMEN (KUB) (SINGLE AP VIEW); Future    Acute on chronic low back pain  -     methylPREDNISolone (MEDROL, PAK,) 4 MG tablet; Use as directed       Obtain Xrays  Drink at least 64 ounces of water daily  Drink 8 ounces of prune juice daily  Increase dietary fiber to 20-25 grams per day or supplement with Citrucel over the counter  Colace (stool softener) 100 mg twice daily  Miralax 17 grams three times daily until BM      Apply a heating pad off and on  Star Medrol Dose pak  Increase Tizanidine to 4 mg three times daily as needed  Increase  Meloxicam to 15 mg (two tablets) once daily with food      There are no discontinued medications.         Apply a heating pad off and on  Star Medrol Dose pak  Increase Tizanidine to 4 mg three times daily as needed  Increase Meloxicam to 15 mg (two  tablets) once daily with food    No follow-ups on file.     Plan of care reviewed with patient who verbalizes understanding and wishes to continue.   Patient to call with any questions or concerns.                 Please note that this chart was generated using dragon dictation software.  Although every effort was made to ensure the accuracy of this automated transcription, some errors in transcription may have occurred.    Electronically signed by Mart Piggs, PA-C on 11/08/2023

## 2023-11-09 ENCOUNTER — Inpatient Hospital Stay: Admit: 2023-11-09 | Payer: MEDICARE | Primary: Student in an Organized Health Care Education/Training Program

## 2023-11-09 DIAGNOSIS — K5900 Constipation, unspecified: Secondary | ICD-10-CM

## 2023-12-16 ENCOUNTER — Ambulatory Visit
Admit: 2023-12-16 | Discharge: 2023-12-16 | Payer: MEDICARE | Attending: Student in an Organized Health Care Education/Training Program | Admitting: Student in an Organized Health Care Education/Training Program | Primary: Student in an Organized Health Care Education/Training Program

## 2023-12-16 VITALS — BP 108/70 | HR 75 | Resp 16 | Ht <= 58 in | Wt 220.8 lb

## 2023-12-16 DIAGNOSIS — G8929 Other chronic pain: Secondary | ICD-10-CM

## 2023-12-16 DIAGNOSIS — M545 Low back pain, unspecified: Principal | ICD-10-CM

## 2023-12-16 MED ORDER — MELOXICAM 7.5 MG PO TABS
7.5 | ORAL_TABLET | Freq: Every day | ORAL | 1 refills | Status: DC
Start: 2023-12-16 — End: 2024-07-27

## 2023-12-16 MED ORDER — TIZANIDINE HCL 2 MG PO TABS
2 | ORAL_TABLET | Freq: Three times a day (TID) | ORAL | 1 refills | Status: AC | PRN
Start: 2023-12-16 — End: ?

## 2023-12-16 MED ORDER — GABAPENTIN 800 MG PO TABS
800 MG | ORAL_TABLET | Freq: Three times a day (TID) | ORAL | 0 refills | Status: DC
Start: 2023-12-16 — End: 2024-04-06

## 2023-12-16 MED ORDER — FLUCONAZOLE 150 MG PO TABS
150 | ORAL_TABLET | ORAL | 0 refills | Status: AC
Start: 2023-12-16 — End: 2023-12-22

## 2023-12-16 NOTE — Progress Notes (Signed)
 Subjective     Patient ID: [name] is a 72 y.o. female who presents for 3 Month Follow-Up (3 month follow up on chronic conditions. /Was on antibiotic and needs something for yeast infection ).     HTN -- Currently on amlodipine 5 and benzaepril 20. Patient

## 2023-12-17 NOTE — Assessment & Plan Note (Signed)
-  PDMP reviewed and appropriate. No drug seeking behavior  -refills of gabapentin sent to pharmacy  -Continue to use OTC pain medication, Flexeril, and Mobic for symptom relief.  Also recommend heating pad.

## 2023-12-17 NOTE — Assessment & Plan Note (Signed)
-  Continue gabapentin 800 3 times daily.  Also able to use Flexeril and Mobic for symptom relief.  Recommend heating pad as well.  -Reviewed PDMP and appropriate.  No drug-seeking behaviors noted.  -Medication helping to take the edge off of pain and help

## 2023-12-17 NOTE — Assessment & Plan Note (Signed)
-  Patient stopped taking Fosamax due to side effects.  Continue vitamin D and calcium.  Recommend weightbearing exercises  -Will consider starting Prolia at next visit per patient request.

## 2023-12-17 NOTE — Assessment & Plan Note (Signed)
 Changes today = none  BP is controlled  Meds: ACEi and calcium channel blocker  Labs: last CMP, lipid panel, and urine microalbumin on March 2024.   Recommend lifestyle modifications such as weight loss, exercising for at least 134min/wk, and low sodium/D

## 2024-02-08 ENCOUNTER — Encounter
Payer: MEDICARE | Attending: Critical Care Medicine | Primary: Student in an Organized Health Care Education/Training Program

## 2024-04-06 ENCOUNTER — Encounter

## 2024-04-07 MED ORDER — BENAZEPRIL HCL 20 MG PO TABS
20 | ORAL_TABLET | Freq: Every day | ORAL | 1 refills | Status: DC
Start: 2024-04-07 — End: 2024-07-25

## 2024-04-07 MED ORDER — GABAPENTIN 800 MG PO TABS
800 | ORAL_TABLET | Freq: Three times a day (TID) | ORAL | 0 refills | Status: DC
Start: 2024-04-07 — End: 2024-07-27

## 2024-04-07 MED ORDER — ALBUTEROL SULFATE HFA 108 (90 BASE) MCG/ACT IN AERS
108 | RESPIRATORY_TRACT | 1 refills | Status: DC | PRN
Start: 2024-04-07 — End: 2024-07-04

## 2024-05-06 MED ORDER — CETIRIZINE HCL 10 MG PO TABS
10 | ORAL_TABLET | Freq: Every day | ORAL | 1 refills | 30.00000 days | Status: DC
Start: 2024-05-06 — End: 2024-11-02

## 2024-05-06 MED ORDER — CETIRIZINE HCL 10 MG PO TABS
10 | ORAL_TABLET | Freq: Every day | ORAL | 1 refills | Status: DC
Start: 2024-05-06 — End: 2024-05-06

## 2024-05-11 ENCOUNTER — Ambulatory Visit
Admit: 2024-05-11 | Discharge: 2024-05-11 | Payer: Medicare (Managed Care) | Attending: Student in an Organized Health Care Education/Training Program | Primary: Student in an Organized Health Care Education/Training Program

## 2024-05-11 VITALS — BP 92/62 | HR 67 | Ht <= 58 in | Wt 228.0 lb

## 2024-05-11 VITALS — BP 101/64 | HR 67 | Ht <= 58 in | Wt 228.0 lb

## 2024-05-11 DIAGNOSIS — Z0000 Encounter for general adult medical examination without abnormal findings: Secondary | ICD-10-CM

## 2024-05-11 DIAGNOSIS — F331 Major depressive disorder, recurrent, moderate: Secondary | ICD-10-CM

## 2024-05-11 MED ORDER — GABAPENTIN 100 MG PO CAPS
100 | ORAL_CAPSULE | Freq: Three times a day (TID) | ORAL | 0 refills | 30.00000 days | Status: DC
Start: 2024-05-11 — End: 2024-06-15

## 2024-05-11 MED ORDER — GABAPENTIN 300 MG PO CAPS
300 | ORAL_CAPSULE | Freq: Three times a day (TID) | ORAL | 0 refills | 30.00000 days | Status: DC
Start: 2024-05-11 — End: 2024-06-15

## 2024-05-11 MED ORDER — DULOXETINE HCL 30 MG PO CPEP
30 | ORAL_CAPSULE | Freq: Every day | ORAL | 1 refills | 90.00000 days | Status: DC
Start: 2024-05-11 — End: 2024-11-01

## 2024-05-11 NOTE — Progress Notes (Signed)
 Medicare Annual Wellness Visit    Sue Barber is here for Medicare AWV    Assessment & Plan   Medicare annual wellness visit, subsequent     No follow-ups on file.     Subjective       Patient's complete Health Risk Assessment and screening values have been reviewed and are found in Flowsheets. The following problems were reviewed today and where indicated follow up appointments were made and/or referrals ordered.    Positive Risk Factor Screenings with Interventions:    Fall Risk:  Do you feel unsteady or are you worried about falling? : (!) yes (cane if needed)  2 or more falls in past year?: no  Fall with injury in past year?: no     Interventions:    Patient comments: patient states she does have a cane she will use as needed.  Reviewed medications, home hazards, visual acuity, and co-morbidities that can increase risk for falls  See AVS for additional education material     Depression:  PHQ-2 Score: 2  PHQ-9 Total Score: 5  Total Score Interpretation: 5-9 = mild depression  Interventions:  LPN INTERVENTION GUIDE: SCORE 5-14 = MODERATE DEPRESSION: FOLLOW UP IN 1 WEEK  Patient comments: patient denies suicidal ideation or intent. She states she will discuss some things with her PCP at today's office visit, after her AWV.     Drug Use:   Substance and Sexual Activity   Drug Use Yes    Frequency: 7.0 times per week    Types: Marijuana Karron Pagan)    Comment: smokes daily or gummies for pain     DAST-10 Score: 1  Interpretation: 1-2 indicates low level use. Recommendation: monitor and re-assess at a later date  Interventions:  Patient comments: states she does smoke marijuana or use gummies daily. She does use them for recreation ,as well as for pain relief, See AVS for additional education material        General HRA Questions:  Select all that apply: (!) New or Increased Pain, Loneliness, Social Isolation (incr neuropathy pain; stays home most of the time)  Interventions - Pain:  Patient comments: states she has had  increased neuropathy, and will discuss with her PCP today.  See AVS for additional education material  Interventions - Loneliness:  Patient comments: states she has a roommate, but their schedules are completely different. She states she will get lonely on occasion.  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  See AVS for additional education material      Inactivity:  On average, how many days per week do you engage in moderate to strenuous exercise (like a brisk walk)?: 2 days (!) Abnormal  On average, how many minutes do you engage in exercise at this level?: 20 min  Interventions:  See AVS for additional education material    Poor Eating Habits/Diet:  Do you eat balanced/healthy meals regularly?: (!) No  Interventions:  See AVS for additional education material    Abnormal BMI (obese):  Body mass index is 48.48 kg/m. (!) Abnormal  Interventions:  See AVS for additional education material        Dentist Screen:  Have you seen the dentist within the past year?: (!) No (trying to find a new dentist)    Intervention:  Patient comments: states she has been looking for a new dentist. States her insurance does  not have very many to choose from.  Advised to schedule with their dentist  See AVS for additional education material        Advanced Directives:  Do you have a Living Will?: (!) No    Intervention:  has NO advanced directive - information provided-Living Will forms also given to patient today.                     Objective   Vitals:    05/11/24 1205   BP: 92/62   Pulse: 67   SpO2: 95%   Weight: 103.4 kg (228 lb)   Height: 1.461 m (4' 9.5")      Body mass index is 48.48 kg/m.                  No Known Allergies  Prior to Visit Medications    Medication Sig Taking? Authorizing Provider   Riboflavin (VITAMIN B-2 PO) Take by mouth daily  [provider]   gabapentin  (NEURONTIN ) 300 MG capsule  Take 1 capsule by mouth 3 times daily for 180 days. Intended supply: 90 days  Dailen Mcclish, Orinda Birkenhead, MD   gabapentin  (NEURONTIN ) 100 MG capsule Take 1 capsule by mouth 3 times daily for 90 days. Intended supply: 90 days  Janele Lague, Orinda Birkenhead, MD   DULoxetine (CYMBALTA) 30 MG extended release capsule Take 1 capsule by mouth daily  Locklyn Henriquez, Orinda Birkenhead, MD   cetirizine  (ZYRTEC ) 10 MG tablet Take 1 tablet by mouth daily  Tauni Sanks, Orinda Birkenhead, MD   benazepril  (LOTENSIN ) 20 MG tablet Take 1 tablet by mouth daily  Thresea Flor, APRN - CNP   gabapentin  (NEURONTIN ) 800 MG tablet Take 1 tablet by mouth 3 times daily for 90 days.  Thresea Flor, APRN - CNP   albuterol  sulfate HFA (PROVENTIL ;VENTOLIN ;PROAIR ) 108 (90 Base) MCG/ACT inhaler Inhale 2 puffs into the lungs every 4 hours as needed for Wheezing  Thresea Flor, APRN - CNP   meloxicam  (MOBIC ) 7.5 MG tablet Take 1 tablet by mouth daily  Maxmilian Trostel, Orinda Birkenhead, MD   tiZANidine  (ZANAFLEX ) 2 MG tablet Take 1 tablet by mouth every 8 hours as needed (muscle spasm)  Venisa Frampton, Orinda Birkenhead, MD   azithromycin  (ZITHROMAX  Z-PAK) 250 MG tablet 2 tabs day 1, 1 tab day 2 to 5  Patient not taking: Reported on 05/11/2024  Ranginwala, Moin Ahmed, MD   alendronate  (FOSAMAX ) 70 MG tablet Take 1 tablet by mouth every 7 days  Patient not taking: Reported on 05/11/2024  Betha Brookes, MD   allopurinol  (ZYLOPRIM ) 300 MG tablet Take 1 tablet by mouth daily  Baylyn Sickles, Orinda Birkenhead, MD   turmeric (QC TUMERIC COMPLEX) 500 MG CAPS Take by mouth daily  [provider]   Ascorbic Acid (VITAMIN C) 1000 MG tablet Take 1 tablet by mouth daily  [provider]   Cholecalciferol (VITAMIN D3) 1.25 MG (50000 UT) CAPS Take 1 capsule by mouth daily  [provider]       CareTeam (Including outside providers/suppliers regularly involved in providing care):   Patient Care Team:  Betha Brookes, MD as PCP - General (Family Medicine)  Alfornia Anis, Orinda Birkenhead, MD as PCP - Empaneled Provider  Jamas Maywood, MD as Consulting  Physician (Cardiology)     Recommendations for Preventive Services Due: see orders and patient instructions/AVS.  Recommended screening schedule for the next 5-10 years is provided to the patient in written form: see Patient Instructions/AVS.     Reviewed  and updated this visit:  Tobacco  Allergies  Meds  Problems  Med Hx  Surg Hx  Fam Hx  Sexual   Hx                This encounter was performed under my, Betha Brookes, MD's, direct supervision, 05/11/2024.

## 2024-05-11 NOTE — Patient Instructions (Signed)
 Preventing Falls: Care Instructions  Injuries and health problems such as trouble walking or poor eyesight can increase your risk of falling. So can some medicines. But there are things you can do to help prevent falls. You can exercise to get stronger. You can also arrange your home to make it safer.    Talk to your doctor about the medicines you take. Ask if any of them increase the risk of falls and whether they can be changed or stopped.   Try to exercise regularly. It can help improve your strength and balance. This can help lower your risk of falling.         Practice fall safety and prevention.   Wear low-heeled shoes that fit well and give your feet good support. Talk to your doctor if you have foot problems that make this hard.  Carry a cellphone or wear a medical alert device that you can use to call for help.  Use stepladders instead of chairs to reach high objects. Don't climb if you're at risk for falls. Ask for help, if needed.  Wear the correct eyeglasses, if you need them.        Make your home safer.   Remove rugs, cords, clutter, and furniture from walkways.  Keep your house well lit. Use night-lights in hallways and bathrooms.  Install and use sturdy handrails on stairways.  Wear nonskid footwear, even inside. Don't walk barefoot or in socks without shoes.        Be safe outside.   Use handrails, curb cuts, and ramps whenever possible.  Keep your hands free by using a shoulder bag or backpack.  Try to walk in well-lit areas. Watch out for uneven ground, changes in pavement, and debris.  Be careful in the winter. Walk on the grass or gravel when sidewalks are slippery. Use de-icer on steps and walkways. Add non-slip devices to shoes.    Put grab bars and nonskid mats in your shower or tub and near the toilet. Try to use a shower chair or bath bench when bathing.   Get into a tub or shower by putting in your weaker leg first. Get out with your strong side first. Have a phone or medical alert  device in the bathroom with you.   Where can you learn more?  Go to [employer] and enter G117 to learn more about "Preventing Falls: Care Instructions."  Current as of: July 28, 2023  Content Version: 14.4   2024-2025 [place], [place].   Care instructions adapted under license by [facility]. If you have questions about a medical condition or this instruction, always ask your healthcare professional. Laren Player, [facility] disclaims any warranty or liability for your use of this information.         Learning About Mindfulness for Stress  What are mindfulness and stress?     Stress is your body's response to a hard situation. Your body can have a physical, emotional, or mental response. A lot of things can cause stress. You may feel stress when you go on a job interview, take a test, or run a race. This kind of short-term stress is normal and even useful. It can help you if you need to work hard or react quickly.  Stress also can last a long time. Long-term stress is caused by stressful situations or events. Examples of long-term stress include long-term health problems, ongoing problems at work, and conflicts in your family. Long-term stress can harm your health.  Mindfulness is a focus only on things happening in the present moment. It's a process of purposefully paying attention to and being aware of your surroundings, your emotions, your thoughts, and how your body feels. You are aware of these things, but you aren't judging these experiences as "good" or "bad." Mindfulness can help you learn to calm your mind and body to help you cope with illness, pain, and stress.  How does mindfulness help to relieve stress?  Mindfulness can help quiet your mind and relax your body. Studies show that it can help some people sleep better, feel less anxious, and bring their blood pressure down. And it's been shown to help some people live and cope better with certain health problems like heart  disease, depression, chronic pain, and cancer.  How do you practice mindfulness?  To be mindful is to pay attention, to be present, and to be accepting. Like any new skill or habit, being mindful can take practice.  When you're mindful, you do just one thing and you pay close attention to that one thing. For example, you may sit quietly and notice your emotions or how your food tastes and smells.  When you're present, you focus on the things that are happening right now. You let go of your thoughts about the past and the future. When you dwell on the past or the future, you miss moments that can heal and strengthen you. You may miss moments like hearing a child laugh or seeing a friendly face when you think you're all alone.  When you're accepting, you don't judge the present moment. Instead you accept your thoughts and feelings as they come.  You can practice anytime, anywhere, and in any way you choose. You can practice in many ways. Here are a few ideas:  While doing your chores, like washing the dishes, let your mind focus on what's in your hand. What does the dish feel like? Is the water warm or cold?  Go outside and take a few deep breaths. What is the air like? Is it warm or cold?  When you can, take some time at the start of your day to sit alone and think.  Take a slow walk by yourself. Count your steps while you breathe in and out.  Try yoga breathing exercises, stretches, and poses to strengthen and relax your muscles.  At work, if you can, try to stop for a few moments each hour. Note how your body feels. Let yourself regroup and let your mind settle before you return to what you were doing.  If you struggle with anxiety or "worry thoughts," imagine your mind as a blue sky and your worry thoughts as clouds. Now imagine those worry thoughts floating across your mind's sky. Just let them pass by as you watch.  Follow-up care is a key part of your treatment and safety. Be sure to make and go to all  appointments, and call your doctor if you are having problems. It's also a good idea to know your test results and keep a list of the medicines you take.  Where can you learn more?  Go to [employer] and enter M676 to learn more about "Learning About Mindfulness for Stress."  Current as of: July 28, 2023  Content Version: 14.4   2024-2025 [place], [place].   Care instructions adapted under license by [facility]. If you have questions about a medical condition or this instruction, always ask your healthcare professional. [place], [place], disclaims  any warranty or liability for your use of this information.         Substance Use Disorder: Care Instructions  Overview     You can improve your life and health by stopping your use of alcohol or drugs. When you don't drink or use drugs, you may feel and sleep better. You may get along better with your family, friends, and coworkers. There are medicines and programs that can help with substance use disorder.  How can you care for yourself at home?  Here are some ways to help you stay sober and prevent relapse.  If you have been given medicine to help keep you sober or reduce your cravings, be sure to take it exactly as prescribed.  Talk to your doctor about programs that can help you stop using drugs or drinking alcohol.  Do not keep alcohol or drugs in your home.  Plan ahead. Think about what you'll say if other people ask you to drink or use drugs. Try not to spend time with people who drink or use drugs.  Use the time and money spent on drinking or drugs to do something that's important to you.  Preventing a relapse  Have a plan to deal with relapse. Learn to recognize changes in your thinking that lead you to drink or use drugs. Get help before you start to drink or use drugs again.  Try to stay away from situations, friends, or places that may lead you to drink or use drugs.  If you feel the need to drink alcohol or use drugs  again, seek help right away. Call a trusted friend or family member. Some people get support from organizations such as Narcotics Anonymous or SMART Recovery or from treatment facilities.  If you relapse, get help as soon as you can. Some people make a plan with another person that outlines what they want that person to do for them if they relapse. The plan usually includes how to handle the relapse and who to notify in case of relapse.  Don't give up. Remember that a relapse doesn't mean that you have failed. Use the experience to learn the triggers that lead you to drink or use drugs. Then quit again. Recovery is a lifelong process. Many people have several relapses before they are able to quit for good.  Follow-up care is a key part of your treatment and safety. Be sure to make and go to all appointments, and call your doctor if you are having problems. It's also a good idea to know your test results and keep a list of the medicines you take.  When should you call for help?   Call 911  anytime you think you may need emergency care. For example, call if you or someone else:    Has overdosed or has withdrawal signs. Be sure to tell the emergency workers that you are or someone else is using or trying to quit using drugs. Overdose or withdrawal signs may include:  Losing consciousness.  Seizure.  Seeing or hearing things that aren't there (hallucinations).     Is thinking or talking about suicide or harming others.   Where to get help 24 hours a day, 7 days a week   If you or someone you know talks about suicide, self-harm, a mental health crisis, a substance use crisis, or any other kind of emotional distress, get help right away. You can:    Call the Suicide and Crisis Lifeline at 62.  Call 1-800-273-TALK (346-146-9687).     Text HOME to (859)602-7269 to access the Crisis Text Line.   Consider saving these numbers in your phone.  Go to 988lifeline.org for more information or to chat online.  Call your doctor now or  seek immediate medical care if:    You are having withdrawal symptoms. These may include nausea or vomiting, sweating, shakiness, and anxiety.   Watch closely for changes in your health, and be sure to contact your doctor if:    You have a relapse.     You need more help or support to stop.   Where can you learn more?  Go to [employer] and enter H573 to learn more about "Substance Use Disorder: Care Instructions."  Current as of: August 17, 2023  Content Version: 14.4   2024-2025 [place], [place].   Care instructions adapted under license by [facility]. If you have questions about a medical condition or this instruction, always ask your healthcare professional. Laren Player, [facility], disclaims any warranty or liability for your use of this information.         Learning About Emotional Support  When do you need emotional support?     You might find getting support from others helpful when you have a long-term health problem. Often people feel alone, confused, or scared when coping with an illness. But you aren't alone. Other people are going through the same thing you are and know how you feel.  Talking with others about your feelings can help you feel better.  Your family and friends can give you support. So can your doctor, a support group, or a church. If you have a support network, you will not feel as alone. You will learn new ways to deal with your situation, and you may try harder to overcome it.  Where you can get support  Family and friends: They can help you cope by giving you comfort and encouragement.  Counseling: Professional counseling can help you cope with situations that interfere with your life and cause stress. Counseling can help you understand and deal with your illness.  Your doctor: Find a doctor you trust and feel comfortable with. Be open and honest about your fears and concerns. Your doctor can help you get the right medical treatments, including  counseling.  Spiritual or religious groups: They can provide comfort and may be able to help you find counseling or other social support services.  Social groups: They can help you meet new people and get involved in activities you enjoy.  Community support groups: In a support group, you can talk to others who have dealt with the same problems or illness as you. You can encourage one another and learn ways to cope with tough emotions.  How can you find a support group?  Finding a support group that works for you may take time. There are many options. Some groups have a group leader who helps lead discussions or shares information. Others are less formal. Some meet in person, while others meet online.  Try using these resources to help you find the best support group for you.  Your doctor, health care team, or counselor.  People with the same health concern.  Your local church, mosque, synagogue, or other religious group.  A city, state, or national group that provides support for your health concern. Check your local library or community center for a list of these groups. Or look for information online.  Your local community, friends, and  family.  Supportive relationships  A supportive relationship includes emotional support such as love, trust, and understanding, as well as advice and concrete help, such as help managing your time.  Reach out to others  Family and friends can help you. Ask them to:  Listen to you and give you encouragement. This can keep you from feeling hopeless or alone.  Help with small daily tasks or with bigger problems. A helping hand can keep you from feeling overwhelmed.  Help you manage a health problem. For example, ask them to go to doctor visits with you. Your loved ones can offer support by being involved in your medical care.  Respect your relationships  A good relationship is also a two-way street. You count on help from others, but they also count on you.  Know your friends'  limits. You don't have to see or call your friends every day. If you are going through a rough patch, ask friends if you can contact them outside of the usual boundaries.  Don't always complain or talk about yourself. Know when it's time to stop talking and listen or just enjoy your friend's company.  Know that good friends can be a bad influence. For example, if a friend encourages you to drink when you know it will harm you, you may want to end the friendship.  Where can you learn more?  Go to [employer] and enter G092 to learn more about "Learning About Emotional Support."  Current as of: July 28, 2023  Content Version: 14.4   2024-2025 [place], [place].   Care instructions adapted under license by [facility]. If you have questions about a medical condition or this instruction, always ask your healthcare professional. Laren Player, [facility], disclaims any warranty or liability for your use of this information.         Learning About Being Active as an Older Adult  Why is being active important as you get older?     Being active is one of the best things you can do for your health. And it's never too late to start. Being active--or getting active, if you aren't already--has definite benefits. It can:  Give you more energy,  Keep your mind sharp.  Improve balance to reduce your risk of falls.  Help you manage chronic illness with fewer medicines.  No matter how old you are, how fit you are, or what health problems you have, there is a form of activity that will work for you. And the more physical activity you can do, the better your overall health will be.  What kinds of activity can help you stay healthy?  Being more active will make your daily activities easier. Physical activity includes planned exercise and things you do in daily life. There are four types of activity:  Aerobic.  Doing aerobic activity makes your heart and lungs strong.  Includes walking, dancing, and  gardening.  Aim for at least 2 hours spread throughout the week.  It improves your energy and can help you sleep better.  Muscle-strengthening.  This type of activity can help maintain muscle and strengthen bones.  Includes climbing stairs, using resistance bands, and lifting or carrying heavy loads.  Aim for at least twice a week.  It can help protect the knees and other joints.  Stretching.  Stretching gives you better range of motion in joints and muscles.  Includes upper arm stretches, calf stretches, and gentle yoga.  Aim for at least twice a week, preferably after  your muscles are warmed up from other activities.  It can help you function better in daily life.  Balancing.  This helps you stay coordinated and have good posture.  Includes heel-to-toe walking, tai chi, and certain types of yoga.  Aim for at least 3 days a week.  It can reduce your risk of falling.  Even if you have a hard time meeting the recommendations, it's better to be more active than less active. All activity done in each category counts toward your weekly total. You'd be surprised how daily things like carrying groceries, keeping up with grandchildren, and taking the stairs can add up.  What keeps you from being active?  If you've had a hard time being more active, you're not alone. Maybe you remember being able to do more. Or maybe you've never thought of yourself as being active. It's frustrating when you can't do the things you want. Being more active can help. What's holding you back?  Getting started.  Have a goal, but break it into easy tasks. Small steps build into big accomplishments.  Staying motivated.  If you feel like skipping your activity, remember your goal. Maybe you want to move better and stay independent. Every activity gets you one step closer.  Not feeling your best.  Start with 5 minutes of an activity you enjoy. Prove to yourself you can do it. As you get comfortable, increase your time.  You may not be where you  want to be. But you're in the process of getting there. Everyone starts somewhere.  How can you find safe ways to stay active?  Talk with your doctor about any physical challenges you're facing. Make a plan with your doctor if you have a health problem or aren't sure how to get started with activity.  If you're already active, ask your doctor if there is anything you should change to stay safe as your body and health change.  If you tend to feel dizzy after you take medicine, avoid activity at that time. Try being active before you take your medicine. This will reduce your risk of falls.  If you plan to be active at home, make sure to clear your space before you get started. Remove things like TV cords, coffee tables, and throw rugs. It's safest to have plenty of space to move freely.  The key to getting more active is to take it slow and steady. Try to improve only a little bit at a time. Pick just one area to improve on at first. And if an activity hurts, stop and talk to your doctor.  Where can you learn more?  Go to [employer] and enter P600 to learn more about "Learning About Being Active as an Older Adult."  Current as of: July 28, 2023  Content Version: 14.4   2024-2025 [place], [place].   Care instructions adapted under license by [facility]. If you have questions about a medical condition or this instruction, always ask your healthcare professional. Laren Player, [facility], disclaims any warranty or liability for your use of this information.         Learning About Dental Care for Older Adults  Dental care for older adults: Overview  Dental care for older people is much the same as for younger adults. But older adults do have concerns that younger adults do not. Older adults may have problems with gum disease and decay on the roots of their teeth. They may need missing teeth replaced or broken fillings  fixed. Or they may have dentures that need to be cared for. Some older  adults may have trouble holding a toothbrush.  You can help remind the person you are caring for to brush and floss their teeth or to clean their dentures. In some cases, you may need to do the brushing and other dental care tasks. People who have trouble using their hands or who have dementia may need this extra help.  How can you help with dental care?  Normal dental care  To keep the teeth and gums healthy:  Brush the teeth with fluoride toothpaste twice a day--in the morning and at night--and floss at least once a day. Plaque can quickly build up on the teeth of older adults.  Watch for the signs of gum disease. These signs include gums that bleed after brushing or after eating hard foods, such as apples.  See a dentist regularly. Many experts recommend checkups every 6 months.  Keep the dentist up to date on any new medications the person is taking.  Encourage a balanced diet that includes whole grains, vegetables, and fruits, and that is low in saturated fat and sodium.  Encourage the person you're caring for not to use tobacco products. They can affect dental and general health.  Many older adults have a fixed income and feel that they can't afford dental care. But most towns and cities have programs in which dentists help older adults by lowering fees. Contact your area's public health offices or social services for information about dental care in your area.  Using a toothbrush  Older adults with arthritis sometimes have trouble brushing their teeth because they can't easily hold the toothbrush. Their hands and fingers may be stiff, painful, or weak. If this is the case, you can:  Offer an Mining engineer toothbrush.  Enlarge the handle of a non-electric toothbrush by wrapping a sponge, an elastic bandage, or adhesive tape around it.  Push the toothbrush handle through a ball made of rubber or soft foam.  Make the handle longer and thicker by taping Popsicle sticks or tongue depressors to it.  You may also be able  to buy special toothbrushes, toothpaste dispensers, and floss holders.  Your doctor may recommend a soft-bristle toothbrush if the person you care for bleeds easily. Bleeding can happen because of a health problem or from certain medicines.  A toothpaste for sensitive teeth may help if the person you care for has sensitive teeth.  How do you brush and floss someone's teeth?  If the person you are caring for has a hard time cleaning their teeth on their own, you may need to brush and floss their teeth for them. It may be easiest to have the person sit and face away from you, and to sit or stand behind them. That way you can steady their head against your arm as you reach around to floss and brush their teeth. Choose a place that has good lighting and is comfortable for both of you.  Before you begin, gather your supplies. You will need gloves, floss, a toothbrush, and a container to hold water if you are not near a sink. Wash and dry your hands well and put on gloves. Start by flossing:  Gently work a piece of floss between each of the teeth toward the gums. A plastic flossing tool may make this easier, and they are available at most drugstores.  Curve the floss around each tooth into a U-shape and gently slide it under  the gum line.  Move the floss firmly up and down several times to scrape off the plaque.  After you've finished flossing, throw away the used floss and begin brushing:  Wet the brush and apply toothpaste.  Place the brush at a 45-degree angle where the teeth meet the gums. Press firmly, and move the brush in small circles over the surface of the teeth.  Be careful not to brush too hard. Vigorous brushing can make the gums pull away from the teeth and can scratch the tooth enamel.  Brush all surfaces of the teeth, on the tongue side and on the cheek side. Pay special attention to the front teeth and all surfaces of the back teeth.  Brush chewing surfaces with short back-and-forth strokes.  After you've  finished, help the person rinse the remaining toothpaste from their mouth.  Where can you learn more?  Go to [employer] and enter F944 to learn more about "Learning About Dental Care for Older Adults."  Current as of: July 28, 2023  Content Version: 14.4   2024-2025 [place], [place].   Care instructions adapted under license by [facility]. If you have questions about a medical condition or this instruction, always ask your healthcare professional. Laren Player, [facility], disclaims any warranty or liability for your use of this information.         Eating Healthy Foods: Care Instructions  With every meal, you can make healthy food choices. Try to eat a variety of fruits, vegetables, whole grains, lean proteins, and low-fat dairy products. This can help you get the right balance of nutrients, including vitamins and minerals. Small changes add up over time. You can start by adding one healthy food to your meals each day.    Try to make half your plate fruits and vegetables, one-fourth whole grains, and one-fourth lean proteins. Try including dairy with your meals.   Eat more fruits and vegetables. Try to have them with most meals and snacks.   Foods for healthy eating        Fruits   These can be fresh, frozen, canned, or dried.  Try to choose whole fruit rather than fruit juice.  Eat a variety of colors.        Vegetables   These can be fresh, frozen, canned, or dried.  Beans, peas, and lentils count too.        Whole grains   Choose whole-grain breads, cereals, and noodles.  Try brown rice.        Lean proteins   These can include lean meat, poultry, fish, and eggs.  You can also have tofu, beans, peas, lentils, nuts, and seeds.        Dairy   Try milk, yogurt, and cheese.  Choose low-fat or fat-free when you can.  If you need to, use lactose-free milk or fortified plant-based milk products, such as soy milk.        Water   Drink water when you're thirsty.  Limit sugar-sweetened  drinks, including soda, fruit drinks, and sports drinks.  Where can you learn more?  Go to [employer].ca and enter T756 to learn more about "Eating Healthy Foods: Care Instructions."  Current as of: October 04, 2023  Content Version: 14.4   2024-2025 [place], [place].   Care instructions adapted under license by [facility]. If you have questions about a medical condition or this instruction, always ask your healthcare professional. Laren Player, [facility], disclaims any warranty or liability for your use  of this information.         Starting a Weight-Loss Plan: Care Instructions  Overview    It can be a challenge to lose weight. But your doctor can help you make a weight-loss plan that meets your needs.  You don't have to make a lot of big changes at once. A better idea might be to focus on small changes and stick with them. When those changes become habit, you can add a few more changes.  Some people find it helpful to take an exercise or nutrition class. If you have questions, ask your doctor about seeing a registered dietitian or an exercise specialist. You might also think about joining a weight-loss support group.  If you're not ready to make changes right now, try to pick a date in the future. Then make an appointment with your doctor to talk about when and how you'll get started with a plan.  Follow-up care is a key part of your treatment and safety. Be sure to make and go to all appointments, and call your doctor if you are having problems. It's also a good idea to know your test results and keep a list of the medicines you take.  How can you care for yourself as you start a weight-loss plan?  Set realistic goals. Many people expect to lose much more weight than is likely. A weight loss of 5% to 10% of your body weight may be enough to improve your health.  Get family and friends involved to provide support. Talk to them about why you are trying to lose weight, and ask them to  help. They can help by participating in exercise and having meals with you, even if they may be eating something different.  Find what works best for you. If you do not have time or do not like to cook, a program that offers meal replacement bars or shakes may be better for you. Or if you like to prepare meals, finding a plan that includes daily menus and recipes may be best.  Ask your doctor about other health professionals who can help you achieve your weight-loss goals.  A dietitian can help you make healthy changes in your diet.  An exercise specialist or personal trainer can help you develop a safe and effective exercise program.  A counselor or psychiatrist can help you cope with issues such as depression, anxiety, or family problems that can make it hard to focus on weight loss.  Consider joining a support group for people who are trying to lose weight. Your doctor can suggest groups in your area.  Where can you learn more?  Go to [employer] and enter U357 to learn more about "Starting a Weight-Loss Plan: Care Instructions."  Current as of: April 27, 2023  Content Version: 14.4   2024-2025 [place], [place].   Care instructions adapted under license by [facility]. If you have questions about a medical condition or this instruction, always ask your healthcare professional. Laren Player, [facility], disclaims any warranty or liability for your use of this information.         Advance Directives: Care Instructions  Overview  An advance directive is a legal way to state your wishes at the end of your life. It tells your family and your doctor what to do if you can't say what you want.  There are two main types of advance directives. You can change them any time your wishes change.  Living will.  This form  tells your family and your doctor your wishes about life support and other treatment. The form is also called a declaration.  Medical power of attorney.  This form lets you name a  person to make treatment decisions for you when you can't speak for yourself. This person is called a health care agent (health care proxy, health care surrogate). The form is also called a durable power of attorney for health care.  If you do not have an advance directive, decisions about your medical care may be made by a family member, or by a doctor or a judge who doesn't know you.  It may help to think of an advance directive as a gift to the people who care for you. If you have one, they won't have to make tough decisions by themselves.  For more information, including forms for your state, see the [employer] website ([employer].com.cy).  Follow-up care is a key part of your treatment and safety. Be sure to make and go to all appointments, and call your doctor if you are having problems. It's also a good idea to know your test results and keep a list of the medicines you take.  What should you include in an advance directive?  Many states have a unique advance directive form. (It may ask you to address specific issues.) Or you might use a universal form that's approved by many states.  If your form doesn't tell you what to address, it may be hard to know what to include in your advance directive. Use the questions below to help you get started.  Who do you want to make decisions about your medical care if you are not able to?  What life-support measures do you want if you have a serious illness that gets worse over time or can't be cured?  What are you most afraid of that might happen? (Maybe you're afraid of having pain, losing your independence, or being kept alive by machines.)  Where would you prefer to die? (Your home? A hospital? A nursing home?)  Do you want to donate your organs when you die?  Do you want certain religious practices performed before you die?  When should you call for help?  Be sure to contact your doctor if you have any questions.  Where can you learn  more?  Go to [employer].ca and enter R264 to learn more about "Advance Directives: Care Instructions."  Current as of: February 26, 2023  Content Version: 14.4   2024-2025 [place], [place].   Care instructions adapted under license by [facility]. If you have questions about a medical condition or this instruction, always ask your healthcare professional. Laren Player, [facility], disclaims any warranty or liability for your use of this information.         A Healthy Heart: Care Instructions  Overview     Coronary artery disease, also called heart disease, occurs when a substance called plaque builds up in the vessels that supply oxygen-rich blood to your heart muscle. This can narrow the blood vessels and reduce blood flow. A heart attack happens when blood flow is completely blocked. A high-fat diet, smoking, and other factors increase the risk of heart disease.  Your doctor has found that you have a chance of having heart disease. A heart-healthy lifestyle can help keep your heart healthy and prevent heart disease. This lifestyle includes eating healthy, being active, staying at a weight that's healthy for you, and not smoking or using tobacco. It also includes  taking medicines as directed, managing other health conditions, and trying to get a healthy amount of sleep.  Follow-up care is a key part of your treatment and safety. Be sure to make and go to all appointments, and call your doctor if you are having problems. It's also a good idea to know your test results and keep a list of the medicines you take.  How can you care for yourself at home?  Diet    Use less salt when you cook and eat. This helps lower your blood pressure. Taste food before salting. Add only a little salt when you think you need it. With time, your taste buds will adjust to less salt.     Eat fewer snack items, fast foods, canned soups, and other high-salt, high-fat, processed foods.     Read food labels and try to  avoid saturated and trans fats. They increase your risk of heart disease by raising cholesterol levels.     Limit the amount of solid fat--butter, margarine, and shortening--you eat. Use olive, peanut, or canola oil when you cook. Bake, broil, and steam foods instead of frying them.     Eat a variety of fruit and vegetables every day. Dark green, deep orange, red, or yellow fruits and vegetables are especially good for you. Examples include spinach, carrots, peaches, and berries.     Foods high in fiber can reduce your cholesterol and provide important vitamins and minerals. High-fiber foods include whole-grain cereals and breads, oatmeal, beans, brown rice, citrus fruits, and apples.     Eat lean proteins. Heart-healthy proteins include seafood, lean meats and poultry, eggs, beans, peas, nuts, seeds, and soy products.     Limit drinks and foods with added sugar. These include candy, desserts, and soda pop.   Heart-healthy lifestyle    If your doctor recommends it, get more exercise. For many people, walking is a good choice. Or you may want to swim, bike, or do other activities. Bit by bit, increase the time you're active every day. Try for at least 30 minutes on most days of the week.     Try to quit or cut back on using tobacco and other nicotine products. This includes smoking and vaping. If you need help quitting, talk to your doctor about stop-smoking programs and medicines. These can increase your chances of quitting for good. Quitting is one of the most important things you can do to protect your heart. It is never too late to quit. Try to avoid secondhand smoke too.     Stay at a weight that's healthy for you. Talk to your doctor if you need help losing weight.     Try to get 7 to 9 hours of sleep each night.     Limit alcohol to 2 drinks a day for men and 1 drink a day for women. Too much alcohol can cause health problems.     Manage other health problems such as diabetes, high blood pressure, and high  cholesterol. If you think you may have a problem with alcohol or drug use, talk to your doctor.   Medicines    Take your medicines exactly as prescribed. Call your doctor if you think you are having a problem with your medicine.     If your doctor recommends aspirin, take the amount directed each day. Make sure you take aspirin and not another kind of pain reliever, such as acetaminophen (Tylenol).   When should you call for help?  Call 911 if you have symptoms of a heart attack. These may include:    Chest pain or pressure, or a strange feeling in the chest.     Sweating.     Shortness of breath.     Pain, pressure, or a strange feeling in the back, neck, jaw, or upper belly or in one or both shoulders or arms.     Lightheadedness or sudden weakness.     A fast or irregular heartbeat.   After you call 911, the operator may tell you to chew 1 adult-strength or 2 to 4 low-dose aspirin. Wait for an ambulance. Do not try to drive yourself.  Watch closely for changes in your health, and be sure to contact your doctor if you have any problems.  Where can you learn more?  Go to [employer].ca and enter F075 to learn more about "A Healthy Heart: Care Instructions."  Current as of: July 28, 2023  Content Version: 14.4   2024-2025 [place], [place].   Care instructions adapted under license by [facility]. If you have questions about a medical condition or this instruction, always ask your healthcare professional. Laren Player, [facility] disclaims any warranty or liability for your use of this information.    Personalized Preventive Plan for Sue Barber - 05/11/2024  Medicare offers a range of preventive health benefits. Some of the tests and screenings are paid in full while other may be subject to a deductible, co-insurance, and/or copay.  Some of these benefits include a comprehensive review of your medical history including lifestyle, illnesses that may run in your family, and various  assessments and screenings as appropriate.  After reviewing your medical record and screening and assessments performed today your provider may have ordered immunizations, labs, imaging, and/or referrals for you.  A list of these orders (if applicable) as well as your Preventive Care list are included within your After Visit Summary for your review.

## 2024-05-11 NOTE — Progress Notes (Signed)
 Subjective     Patient ID: Sue Barber is a 73 y.o. female who presents for Other (Discuss med changes).     Patient reports that her back pain has gotten much worse.  Previously was on gabapentin  1200 mg 3 times daily.  At last visit we decreased dose down to 800 mg 3 times daily.  Says that for a while she used to do okay being on 900 mg 3 times daily.  Used to go to pain management, who was the ones that prescribed the higher dose of gabapentin .  Not currently going to Pain management since she moved here from [place] .    Depression--feels like her mood has been down recently.  Feels very emotional and gets tearful easily.  Has had a lot of life changes recently including worrying about her son who is going through extreme depression.  Does not currently go to counseling.  Previously on Wellbutrin .  Has not tried any other medications for depression before.         Review of Systems   Constitutional:  Negative for activity change, appetite change and fever.   HENT:  Negative for congestion, rhinorrhea and sore throat.    Eyes:  Negative for visual disturbance.   Respiratory:  Negative for cough, shortness of breath and wheezing.    Cardiovascular:  Negative for chest pain, palpitations and leg swelling.   Gastrointestinal:  Negative for constipation and diarrhea.   Musculoskeletal:  Positive for arthralgias and back pain.   Skin:  Negative for rash.   Neurological:  Negative for headaches.   Psychiatric/Behavioral:  Positive for dysphoric mood.    All other systems reviewed and are negative.       Objective   Vitals:    05/11/24 1151   BP: 101/64   Pulse: 67   SpO2: 95%       Physical Exam  Vitals and nursing note reviewed.   Constitutional:       General: She is not in acute distress.     Appearance: Normal appearance. She is obese. She is not ill-appearing, toxic-appearing or diaphoretic.   HENT:      Head: Normocephalic and atraumatic.      Nose: Nose normal.      Mouth/Throat:      Mouth: Mucous membranes are moist.       Pharynx: Oropharynx is clear.   Eyes:      Extraocular Movements: Extraocular movements intact.      Conjunctiva/sclera: Conjunctivae normal.   Cardiovascular:      Rate and Rhythm: Normal rate and regular rhythm.      Heart sounds: Normal heart sounds.   Pulmonary:      Breath sounds: Normal breath sounds. No wheezing, rhonchi or rales.   Musculoskeletal:         General: Normal range of motion.      Cervical back: No tenderness.      Right lower leg: No edema.      Left lower leg: No edema.   Lymphadenopathy:      Cervical: No cervical adenopathy.   Skin:     General: Skin is warm.      Findings: No lesion or rash.   Neurological:      General: No focal deficit present.      Mental Status: She is alert and oriented to person, place, and time. Mental status is at baseline.   Psychiatric:         Behavior: Behavior normal.  Thought Content: Thought content normal.         Judgment: Judgment normal.      Comments: Tearful during exam         Assessment & Plan     Problem List Items Addressed This Visit       MDD (major depressive disorder), recurrent episode, moderate (HCC) - Primary    -PHQ9 = 5 >> Reviewed, and indicates mild depression.  - Starting Cymbalta 30.  Hoping that this will not only help her depression but also her back pain.           Relevant Medications    gabapentin  (NEURONTIN ) 300 MG capsule (Start on 08/09/2024)    gabapentin  (NEURONTIN ) 100 MG capsule    DULoxetine (CYMBALTA) 30 MG extended release capsule    Neuropathy    -PDMP reviewed and appropriate. No drug seeking behavior  -I am comfortable with increasing gabapentin  up to 900 mg 3 times daily, but if we need to go higher than that, she will need to go to pain management.  Patient expressed understanding and is agreeable.  -Sending 100 mg gabapentin  3 times daily for 90 days, which she can take in addition to her 800 mg tablets.  After that, we will switch to 3 tablets of 300 mg 3 times daily.  -Continue to use OTC pain  medication, Flexeril, and Mobic  for symptom relief.  Also recommend heating pad.               Relevant Medications    gabapentin  (NEURONTIN ) 300 MG capsule (Start on 08/09/2024)    gabapentin  (NEURONTIN ) 100 MG capsule

## 2024-05-12 ENCOUNTER — Encounter

## 2024-05-12 NOTE — Assessment & Plan Note (Signed)
-  PHQ9 = 5 >> Reviewed, and indicates mild depression.  - Starting Cymbalta 30.  Hoping that this will not only help her depression but also her back pain.

## 2024-05-12 NOTE — Assessment & Plan Note (Signed)
-  PDMP reviewed and appropriate. No drug seeking behavior  -I am comfortable with increasing gabapentin  up to 900 mg 3 times daily, but if we need to go higher than that, she will need to go to pain management.  Patient expressed understanding and is agreeable.  -Sending 100 mg gabapentin  3 times daily for 90 days, which she can take in addition to her 800 mg tablets.  After that, we will switch to 3 tablets of 300 mg 3 times daily.  -Continue to use OTC pain medication, Flexeril, and Mobic  for symptom relief.  Also recommend heating pad.

## 2024-05-15 ENCOUNTER — Encounter

## 2024-05-15 MED ORDER — ALLOPURINOL 300 MG PO TABS
300 | ORAL_TABLET | Freq: Every day | ORAL | 1 refills | 90.00000 days | Status: DC
Start: 2024-05-15 — End: 2024-11-01

## 2024-05-15 NOTE — Telephone Encounter (Signed)
 LV  05/11/24    [place]  06/15/24    [facility] on [address]

## 2024-06-15 ENCOUNTER — Ambulatory Visit
Admit: 2024-06-15 | Discharge: 2024-06-15 | Payer: Medicare (Managed Care) | Attending: Student in an Organized Health Care Education/Training Program | Primary: Student in an Organized Health Care Education/Training Program

## 2024-06-15 VITALS — BP 116/54 | HR 86 | Ht <= 58 in | Wt 227.2 lb

## 2024-06-15 DIAGNOSIS — F331 Major depressive disorder, recurrent, moderate: Secondary | ICD-10-CM

## 2024-06-15 NOTE — Progress Notes (Signed)
 Subjective     Patient ID: Sue Barber is a 73 y.o. female who presents for Follow-up (MDD/Discuss shot for left shoulder.) and 1 Month Follow-Up.     Depression--was started on Cymbalta  30 mg at last visit.  Patient states this medication has really helped her tremendously.  Feels that her mood is very stable at this time and feeling much more happy.    Neuropathy and back pain--at last visit increase gabapentin  back to original dose of 900 mg 3 times daily.  Patient reports that with this dose increase along with the start of Cymbalta , pain and neuropathy has improved tremendously.  Declines referral to pain management at this time since symptoms are improving.    HTN -- Currently on amlodipine  5, benazepril  20. Denies headache, vision changes, SOB, chest pain, palpitations, or edema.              Review of Systems   Constitutional:  Negative for activity change, appetite change and fever.   HENT:  Negative for congestion, rhinorrhea and sore throat.    Eyes:  Negative for visual disturbance.   Respiratory:  Negative for cough, shortness of breath and wheezing.    Cardiovascular:  Negative for chest pain, palpitations and leg swelling.   Gastrointestinal:  Negative for constipation and diarrhea.   Musculoskeletal:  Positive for arthralgias and back pain.   Skin:  Negative for rash.   Neurological:  Negative for headaches.   Psychiatric/Behavioral:  Negative for dysphoric mood.    All other systems reviewed and are negative.       Objective   Vitals:    06/15/24 1117   BP: (!) 116/54   Pulse: 86   SpO2: 90%       Physical Exam  Vitals and nursing note reviewed.   Constitutional:       General: She is not in acute distress.     Appearance: Normal appearance. She is obese. She is not ill-appearing, toxic-appearing or diaphoretic.   HENT:      Head: Normocephalic and atraumatic.      Nose: Nose normal.      Mouth/Throat:      Mouth: Mucous membranes are moist.      Pharynx: Oropharynx is clear.   Eyes:      Extraocular  Movements: Extraocular movements intact.      Conjunctiva/sclera: Conjunctivae normal.   Cardiovascular:      Rate and Rhythm: Normal rate and regular rhythm.      Heart sounds: Normal heart sounds.   Pulmonary:      Breath sounds: Normal breath sounds. No wheezing, rhonchi or rales.   Musculoskeletal:         General: Normal range of motion.      Cervical back: No tenderness.      Right lower leg: No edema.      Left lower leg: No edema.   Lymphadenopathy:      Cervical: No cervical adenopathy.   Skin:     General: Skin is warm.      Findings: No lesion or rash.   Neurological:      General: No focal deficit present.      Mental Status: She is alert and oriented to person, place, and time. Mental status is at baseline.   Psychiatric:         Mood and Affect: Mood normal.         Behavior: Behavior normal.         Thought Content:  Thought content normal.         Judgment: Judgment normal.         Assessment & Plan     Problem List Items Addressed This Visit       Vitamin D deficiency    Relevant Orders    Vitamin D 25 Hydroxy    Primary hypertension     Changes today = none  BP is controlled  Meds: ACEi and calcium channel blocker  Labs: last CMP, lipid panel, and urine microalbumin on today.   Recommend lifestyle modifications such as weight loss, exercising for at least 143min/wk, and low sodium/DASH diet.            Relevant Orders    CBC with Auto Differential    Comprehensive Metabolic Panel w/ Reflex to MG    Lipid Panel    Albumin/Creatinine Ratio, Urine    Chronic bilateral low back pain without sciatica     -PDMP reviewed and appropriate. No drug seeking behavior  -Continue gabapentin  900 mg 3 times daily.  Also continue Cymbalta  30  -Continue to use OTC pain medication, Flexeril, and Mobic  for symptom relief.  Also recommend heating pad.         Chronic left shoulder pain    - Due to body habitus, I would prefer to send her to Ortho for joint injection.  -Referral to Ortho placed         Relevant Orders     [place] - Zartman, Franky, MD, Orthopedic Surgery, [place]    MDD (major depressive disorder), recurrent episode, moderate (HCC) - Primary     -stable and well controlled  -continue current medications: cymbalta  30           Neuropathy       -PDMP reviewed and appropriate. No drug seeking behavior  -Continue gabapentin  900 mg 3 times daily.  Also continue Cymbalta  30  -Continue to use OTC pain medication, Flexeril, and Mobic  for symptom relief.  Also recommend heating pad.               Osteoporosis

## 2024-06-16 NOTE — Assessment & Plan Note (Signed)
-   Due to body habitus, I would prefer to send her to Ortho for joint injection.  -Referral to Ortho placed

## 2024-06-16 NOTE — Assessment & Plan Note (Signed)
 Changes today = none  BP is controlled  Meds: ACEi and calcium channel blocker  Labs: last CMP, lipid panel, and urine microalbumin on today.   Recommend lifestyle modifications such as weight loss, exercising for at least 1104min/wk, and low sodium/DASH diet.

## 2024-06-16 NOTE — Assessment & Plan Note (Signed)
-  stable and well controlled  -continue current medications: cymbalta 30

## 2024-06-16 NOTE — Assessment & Plan Note (Signed)
-  PDMP reviewed and appropriate. No drug seeking behavior  -Continue gabapentin  900 mg 3 times daily.  Also continue Cymbalta  30  -Continue to use OTC pain medication, Flexeril, and Mobic  for symptom relief.  Also recommend heating pad.

## 2024-06-20 ENCOUNTER — Encounter

## 2024-06-23 ENCOUNTER — Ambulatory Visit
Admit: 2024-06-23 | Discharge: 2024-06-23 | Payer: Medicare (Managed Care) | Attending: Orthopaedic Surgery | Primary: Student in an Organized Health Care Education/Training Program

## 2024-06-23 ENCOUNTER — Ambulatory Visit
Admit: 2024-06-23 | Payer: Medicare (Managed Care) | Primary: Student in an Organized Health Care Education/Training Program

## 2024-06-23 VITALS — HR 62 | Resp 16 | Ht <= 58 in | Wt 227.0 lb

## 2024-06-23 DIAGNOSIS — R52 Pain, unspecified: Principal | ICD-10-CM

## 2024-06-23 DIAGNOSIS — M25512 Pain in left shoulder: Principal | ICD-10-CM

## 2024-06-23 MED ORDER — TRIAMCINOLONE ACETONIDE 40 MG/ML IJ SUSP
40 | Freq: Once | INTRAMUSCULAR | Status: AC
Start: 2024-06-23 — End: 2024-06-23
  Administered 2024-06-23: 14:00:00 40 mg via INTRA_ARTICULAR

## 2024-06-23 NOTE — Patient Instructions (Signed)
Continue weight-bearing as tolerated.  Continue range of motion exercises as instructed.  Ice and elevate as needed.  Tylenol or Motrin for pain.  Injection given into the left shoulder.  Follow up as needed    We are committed to providing you the best care possible.  If you receive a survey after visiting one of our offices, please take time to share your experience concerning your physician office visit.  These surveys are confidential and no health information about you is shared.  We are eager to improve for you and we are counting on your feedback to help make that happen.

## 2024-06-23 NOTE — Progress Notes (Unsigned)
 Patient seen in office today for left shoulder pain   Pain started in 2021   Date of last injection: nov or dec 2024  Patient reports 8 /10 pain while moving it   RICE and medication are effective to alleviate pain and reduce swelling.   Pain worsened by: Patient reports painful ROM & weight bearing.   Patient is interested in injection today  Xrays performed in office today.   Profession: retired   Left handed

## 2024-06-23 NOTE — Progress Notes (Signed)
 06/23/2024   Chief Complaint   Patient presents with    Shoulder Pain     Left shoulder pain         History of Present Illness:                             Sue Barber is a 73 y.o. female who presents today for evaluation of her left shoulder pain and stiffness.  Symptoms been progressively worsening for about 5 years or more.  [place] and was diagnosed with a severe degenerative left shoulder joint.  She had injection performed about 6 months ago which did provide some pain relief and lasted for a few months but has worn off recently over the last few weeks.  She is having increasing aching soreness with any attempted reaching lifting weightbearing activities.  She has a very hard time elevating and reaching away from her body or overhead.  She gets catching grinding sensations with rotational movements of her shoulder.      Patient seen in office today for left shoulder pain   Pain started in 2021   Date of last injection: nov or dec 2024  Patient reports 8 /10 pain while moving it   RICE and medication are effective to alleviate pain and reduce swelling.   Pain worsened by: Patient reports painful ROM & weight bearing.   Patient is interested in injection today  Xrays performed in office today.   Profession: retired   Left handed           Medical History  Patient's medications, allergies, past medical, surgical, social and family histories were reviewed and updated as appropriate.    Past Medical History:   Diagnosis Date    Arthritis     bilateral shoulders, lower back and both knees    Chronic back pain     Depression     Diverticulosis     Febrile seizure (HCC)     History of nuclear stress test 11/27/2015    cardiolite-normal,EF70%    Hyperlipidemia     Borderline    Hypertension     Obesity     Osteoarthritis     Polyneuropathy     Restless legs     Shortness of breath on exertion     Sleep apnea     Uses CPAP    Wears glasses      Past  Surgical History:   Procedure Laterality Date    APPENDECTOMY  1999    Deone During Cholecystectomy    CHOLECYSTECTOMY  1999    Appendectomy    COLONOSCOPY  2001    Found 2 Polyps    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    JOINT REPLACEMENT Left 02/2010    Total Left Knee    TUBAL LIGATION  1978     Family History   Problem Relation Age of Onset    Heart Disease Mother     High Blood Pressure Mother     Stroke Mother     Obesity Mother     Heart Disease Father     High Blood Pressure Father     Alcohol Abuse Father     Arrhythmia Sister         defibrillator    Breast Cancer Sister 18    Anemia Sister     Other Sister  Left Breast Cancer Sister     Heart Disease Sister     Anemia Sister     Arthritis Sister         psoriatic    Obesity Sister     Osteoporosis Sister     Psoriasis Sister     Cirrhosis Sister     Anemia Brother     Diabetes Brother     Heart Disease Brother         Bypass Surgery    Other Brother     Other Brother     Mental Illness Son         PTSD    Psoriasis Son      Social History     Socioeconomic History    Marital status: Divorced   Tobacco Use    Smoking status: Former     Current packs/day: 0.00     Average packs/day: 0.5 packs/day for 55.2 years (27.6 ttl pk-yrs)     Types: Cigarettes     Start date: 06/19/1963     Quit date: 08/16/2018     Years since quitting: 5.8    Smokeless tobacco: Never   Vaping Use    Vaping status: Never Used   Substance and Sexual Activity    Alcohol use: Not Currently     Comment: Occ. Maybe Once A Month    Drug use: Yes     Frequency: 7.0 times per week     Types: Marijuana (Weed)     Comment: smokes daily or gummies for pain    Sexual activity: Not Currently     Partners: Male     Social Drivers of Health     Financial Resource Strain: Low Risk  (04/24/2022)    Overall Financial Resource Strain (CARDIA)     Difficulty of Paying Living Expenses: Not very hard   Food Insecurity: No Food Insecurity (05/11/2024)    Hunger Vital Sign     Worried  About Running Out of Food in the Last Year: Never true     Ran Out of Food in the Last Year: Never true   Transportation Needs: No Transportation Needs (05/11/2024)    PRAPARE - Therapist, art (Medical): No     Lack of Transportation (Non-Medical): No   Physical Activity: Inactive (06/20/2024)    Exercise Vital Sign     Days of Exercise per Week: 0 days     Minutes of Exercise per Session: 10 min   Intimate Partner Violence: Unknown (04/21/2022)    Humiliation, Afraid, Rape, and Kick questionnaire     Fear of Current or Ex-Partner: No   Housing Stability: Low Risk  (05/11/2024)    Housing Stability Vital Sign     Unable to Pay for Housing in the Last Year: No     Number of Times Moved in the Last Year: 0     Homeless in the Last Year: No     Current Outpatient Medications   Medication Sig Dispense Refill    allopurinol  (ZYLOPRIM ) 300 MG tablet Take 1 tablet by mouth daily 90 tablet 1    Riboflavin (VITAMIN B-2 PO) Take by mouth daily      DULoxetine  (CYMBALTA ) 30 MG extended release capsule Take 1 capsule by mouth daily 90 capsule 1    cetirizine  (ZYRTEC ) 10 MG tablet Take 1 tablet by mouth daily 90 tablet 1    benazepril  (LOTENSIN ) 20 MG tablet Take 1 tablet by mouth  daily 90 tablet 1    gabapentin  (NEURONTIN ) 800 MG tablet Take 1 tablet by mouth 3 times daily for 90 days. (Patient taking differently: Take 900 mg by mouth 3 times daily.) 270 tablet 0    albuterol  sulfate HFA (PROVENTIL ;VENTOLIN ;PROAIR ) 108 (90 Base) MCG/ACT inhaler Inhale 2 puffs into the lungs every 4 hours as needed for Wheezing 18 g 1    meloxicam  (MOBIC ) 7.5 MG tablet Take 1 tablet by mouth daily 90 tablet 1    tiZANidine  (ZANAFLEX ) 2 MG tablet Take 1 tablet by mouth every 8 hours as needed (muscle spasm) 270 tablet 1    azithromycin  (ZITHROMAX  Z-PAK) 250 MG tablet 2 tabs day 1, 1 tab day 2 to 5 (Patient not taking: Reported on 11/08/2023) 6 tablet 0    alendronate  (FOSAMAX ) 70 MG tablet Take 1 tablet by mouth every 7  days (Patient not taking: Reported on 12/16/2023) 13 tablet 1    turmeric (QC TUMERIC COMPLEX) 500 MG CAPS Take by mouth daily      Ascorbic Acid (VITAMIN C) 1000 MG tablet Take 1 tablet by mouth daily      Cholecalciferol (VITAMIN D3) 1.25 MG (50000 UT) CAPS Take 1 capsule by mouth daily       No current facility-administered medications for this visit.     No Known Allergies      Review of Systems   Constitutional:  Negative for chills and fever.   HENT:  Negative for congestion and sneezing.    Eyes:  Negative for pain and redness.   Respiratory:  Negative for chest tightness, shortness of breath and wheezing.    Cardiovascular:  Negative for chest pain and palpitations.   Gastrointestinal:  Negative for vomiting.   Musculoskeletal:  Positive for arthralgias.   Skin:  Negative for color change and rash.   Neurological:  Negative for weakness and numbness.   Psychiatric/Behavioral:  Negative for agitation. The patient is not nervous/anxious.                                                Examination:  General Exam:  Vitals: Pulse 62   Resp 16   Ht 1.448 m (4' 9)   Wt 103 kg (227 lb)   SpO2 95%   BMI 49.12 kg/m    Physical Exam  Vitals and nursing note reviewed.   Constitutional:       Appearance: Normal appearance.   HENT:      Head: Normocephalic and atraumatic.   Eyes:      Conjunctiva/sclera: Conjunctivae normal.      Pupils: Pupils are equal, round, and reactive to light.   Pulmonary:      Effort: Pulmonary effort is normal.   Musculoskeletal:      Right shoulder: No swelling, deformity, laceration, tenderness or bony tenderness. Normal range of motion. Normal strength.      Right elbow: Normal.      Left elbow: Normal. No swelling, deformity, effusion or lacerations. Normal range of motion. No tenderness.      Cervical back: Normal range of motion.      Comments: Left Upper Extremity:    There is moderate to severe tenderness diffusely throughout the shoulder.  Tenderness to palpation is maximal over  the anterior and lateral aspects of the shoulder specifically along the greater tuberosity and proximal biceps tendon.  Active range of motion is severely limited due to pain.  Forward elevation present to 80 degrees, abduction present to 30 degrees, external rotation 15 degrees.  Passive range of motion is equally limited and painful specifically at the extremes.  There is crepitation and grinding which is audible during rotational movements of the shoulder.  Rotator cuff testing is difficult due to pain.  Strength 2/5 forward elevation and abduction of the shoulder.  There is breakaway to strength testing due to pain.  Positive empty can test.  Positive drop arm sign.  Positive Hawkins and Neer signs.  Moderate pain at the acromioclavicular joint with crossarm test.    Skin is intact.  Sensation is intact in the upper extremity.  2+ radial pulse.  No edema.  No muscle atrophy.       Skin:     General: Skin is warm and dry.   Neurological:      Mental Status: She is alert and oriented to person, place, and time.   Psychiatric:         Mood and Affect: Mood normal.         Behavior: Behavior normal.            Diagnostic testing:  X-ray images were reviewed by myself and discussed with the patient:  X-ray images of the left shoulder show evidence of advanced severe glenohumeral joint degenerative disease with complete loss of joint space.  There is extensive subchondral sclerosis and irregularities of the articular surfaces on both sides of joint.  Prominent spurring present at the inferior aspect of the humeral head.  No evidence of fracture or acute abnormality.  Spurring present at the greater tuberosity and inferior aspect of the acromion.     Impression: Severe advanced glenohumeral degenerative joint disease    Office Procedures:  Orders Placed This Encounter   Procedures    DRAIN/INJECT LARGE JOINT/BURSA       Assessment and Plan  1.  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  Her exam is consistent with advanced degenerative joint disease in the left shoulder as well.    The patient's disease process was reviewed in detail.  Treatment options were discussed including options of both operative and nonoperative treatment.  We discussed surgical intervention including the indications, benefits, risks, expected outcome, and recovery process.  We thoughtfully considered possible surgical intervention at this stage but have decided to pursue nonoperative measures at this time.  We will revisit surgery in more detail if conservative measures fail      She understands that she may benefit in the future from surgery but we will revisit in the future if conservative measures fail.    Procedure Note, Left Shoulder glenohumeral joint injection:    Multiple treatment options were discussed.  Joint injection was recommended.  Details of the procedure, potential risks, and potential benefits were discussed.  Patient's questions were answered.  Patient elected to proceed with procedure.    The posterior aspect of the left shoulder joint was prepped with alcohol.  The glenohumeral joint space was then injected using a 22-gauge needle.  The shoulder was injected with 40 mg of Kenalog  and 4 mL of 1% plain lidocaine.  The patient tolerated the procedure well with no complications.  A Band-Aid was applied.    We discussed activity modification and home exercise program with stretching and avoidance of  weightbearing activities.    Follow-up as needed if symptoms worsen we can consider repeat injections so long as it has been 3 months since her last injection      Electronically signed by Robynn Marcel, MD on 06/26/2024 at 10:21 AM

## 2024-06-27 ENCOUNTER — Encounter

## 2024-06-28 LAB — CBC WITH AUTO DIFFERENTIAL
Basophils %: 0.6 %
Basophils Absolute: 0 10*3/uL (ref 0.0–0.2)
Eosinophils %: 0.3 %
Eosinophils Absolute: 0 10*3/uL (ref 0.0–0.6)
Hematocrit: 49.2 % — ABNORMAL HIGH (ref 36.0–48.0)
Hemoglobin: 16.3 g/dL — ABNORMAL HIGH (ref 12.0–16.0)
Lymphocytes %: 29.7 %
Lymphocytes Absolute: 2.1 10*3/uL (ref 1.0–5.1)
MCH: 31.2 pg (ref 26.0–34.0)
MCHC: 33.2 g/dL (ref 31.0–36.0)
MCV: 93.9 fL (ref 80.0–100.0)
MPV: 9 fL (ref 5.0–10.5)
Monocytes %: 6.6 %
Monocytes Absolute: 0.5 10*3/uL (ref 0.0–1.3)
Neutrophils %: 62.8 %
Neutrophils Absolute: 4.4 10*3/uL (ref 1.7–7.7)
Platelets: 200 10*3/uL (ref 135–450)
RBC: 5.24 M/uL — ABNORMAL HIGH (ref 4.00–5.20)
RDW: 14.9 % (ref 12.4–15.4)
WBC: 6.9 10*3/uL (ref 4.0–11.0)

## 2024-06-28 LAB — COMPREHENSIVE METABOLIC PANEL W/ REFLEX TO MG FOR LOW K
ALT: 18 U/L (ref 10–40)
AST: 13 U/L — ABNORMAL LOW (ref 15–37)
Albumin/Globulin Ratio: 2 (ref 1.1–2.2)
Albumin: 4.1 g/dL (ref 3.4–5.0)
Alkaline Phosphatase: 67 U/L (ref 40–129)
Anion Gap: 9 (ref 3–16)
BUN: 23 mg/dL — ABNORMAL HIGH (ref 7–20)
CO2: 30 mmol/L (ref 21–32)
Calcium: 9.3 mg/dL (ref 8.3–10.6)
Chloride: 99 mmol/L (ref 99–110)
Creatinine: 0.8 mg/dL (ref 0.6–1.2)
Est, Glom Filt Rate: 79 (ref >60)
Glucose: 80 mg/dL (ref 70–99)
Potassium reflex Magnesium: 4.4 mmol/L (ref 3.5–5.1)
Sodium: 138 mmol/L (ref 136–145)
Total Bilirubin: 0.3 mg/dL (ref 0.0–1.0)
Total Protein: 6.2 g/dL — ABNORMAL LOW (ref 6.4–8.2)

## 2024-06-28 LAB — LIPID PANEL
Cholesterol, Total: 191 mg/dL (ref 0–199)
HDL: 43 mg/dL (ref 40–60)
LDL Cholesterol: 109 mg/dL — ABNORMAL HIGH (ref <100)
Triglycerides: 197 mg/dL — ABNORMAL HIGH (ref 0–150)
VLDL Cholesterol Calculated: 39 mg/dL

## 2024-06-28 LAB — VITAMIN D 25 HYDROXY: Vit D, 25-Hydroxy: 37.1 ng/mL (ref >=30)

## 2024-07-05 MED ORDER — ALBUTEROL SULFATE HFA 108 (90 BASE) MCG/ACT IN AERS
108 | RESPIRATORY_TRACT | 1 refills | 25.00000 days | Status: DC | PRN
Start: 2024-07-05 — End: 2024-09-26

## 2024-07-13 ENCOUNTER — Encounter: Payer: Medicare (Managed Care) | Primary: Student in an Organized Health Care Education/Training Program

## 2024-07-23 ENCOUNTER — Encounter

## 2024-07-25 MED ORDER — BENAZEPRIL HCL 20 MG PO TABS
20 | ORAL_TABLET | Freq: Every day | ORAL | 1 refills | 90.00000 days | Status: DC
Start: 2024-07-25 — End: 2025-01-24

## 2024-07-27 ENCOUNTER — Encounter

## 2024-07-27 MED ORDER — GABAPENTIN 300 MG PO CAPS
300 | ORAL_CAPSULE | Freq: Three times a day (TID) | ORAL | 2 refills | 30.00000 days | Status: DC
Start: 2024-07-27 — End: 2024-11-01

## 2024-07-27 MED ORDER — MELOXICAM 7.5 MG PO TABS
7.5 | ORAL_TABLET | Freq: Every day | ORAL | 1 refills | 90.00000 days | Status: DC
Start: 2024-07-27 — End: 2025-01-24

## 2024-08-08 ENCOUNTER — Encounter

## 2024-08-09 MED ORDER — TIZANIDINE HCL 2 MG PO TABS
2 | ORAL_TABLET | Freq: Three times a day (TID) | ORAL | 1 refills | 30.00000 days | Status: DC | PRN
Start: 2024-08-09 — End: 2025-01-24

## 2024-09-15 ENCOUNTER — Encounter: Payer: Medicare (Managed Care) | Primary: Student in an Organized Health Care Education/Training Program

## 2024-09-26 ENCOUNTER — Encounter

## 2024-09-26 MED ORDER — ALBUTEROL SULFATE HFA 108 (90 BASE) MCG/ACT IN AERS
108 | RESPIRATORY_TRACT | 1 refills | 25.00000 days | Status: DC | PRN
Start: 2024-09-26 — End: 2025-01-01

## 2024-10-20 ENCOUNTER — Ambulatory Visit
Admit: 2024-10-20 | Discharge: 2024-10-20 | Payer: Medicare (Managed Care) | Attending: Orthopaedic Surgery | Primary: Student in an Organized Health Care Education/Training Program

## 2024-10-20 VITALS — HR 77 | Temp 98.90000°F

## 2024-10-20 DIAGNOSIS — M25512 Pain in left shoulder: Principal | ICD-10-CM

## 2024-10-20 MED ORDER — TRIAMCINOLONE ACETONIDE 40 MG/ML IJ SUSP
40 | Freq: Once | INTRAMUSCULAR | Status: AC
Start: 2024-10-20 — End: 2024-10-20
  Administered 2024-10-20: 16:00:00 40 mg via INTRA_ARTICULAR

## 2024-10-20 NOTE — Patient Instructions (Addendum)
"  Weight bear as tolerated.  Continue with active range of motion as instructed.   Ice and elevate as instructed.  Tylenol or Motrin for pain.  Injection given in left Shoulder today.  No new medication prescribed at this appointment.   Follow up as needed     We are committed to providing you the best care possible.     If you receive a survey after visiting one of our offices, please take time to share your experience concerning your physician office visit.  These surveys are confidential and no health information about you is shared.     We are eager to improve for you and we are counting on your feedback to help make that happen. If you felt my care was outstanding, please mention me by name:     "

## 2024-10-20 NOTE — Progress Notes (Signed)
"  Patient seen in office today for: Left shoulder, last inj 06/23/2024    Patient reports 4/10 pain.  RICE and medication are somewhat effective to alleviate pain and reduce swelling.   Pain worsened by: Patient reports painful ROM & weight bearing.     Patient is interested in injection today    Right handed   "

## 2024-10-20 NOTE — Progress Notes (Signed)
 "      10/20/2024   Chief Complaint   Patient presents with    Follow-up    Shoulder Pain     Left shoulder, last inj 06/23/2024        History of Present Illness:                             Sue Barber is a 73 y.o. female who presents today for evaluation of her left shoulder pain stiffness and weakness.  She received an injection to the left shoulder about 4 months ago which seem to provide good pain relief at first.  Is simply worn off and her pain level has increased over the last few weeks.    Patient seen in office today for: Left shoulder, last inj 06/23/2024     Patient reports 4/10 pain.  RICE and medication are somewhat effective to alleviate pain and reduce swelling.   Pain worsened by: Patient reports painful ROM & weight bearing.      Patient is interested in injection today     Right handed        Symptoms been progressively worsening for about 5 years or more. [place] lived and was diagnosed with a severe degenerative left shoulder joint. She had injection performed about 6 months ago which did provide some pain relief and lasted for a few months but has worn off recently over the last few weeks. She is having increasing aching soreness with any attempted reaching lifting weightbearing activities. She has a very hard time elevating and reaching away from her body or overhead. She gets catching grinding sensations with rotational movements of her shoulder.     Medical History  Patient's medications, allergies, past medical, surgical, social and family histories were reviewed and updated as appropriate.    Past Medical History:   Diagnosis Date    Arthritis     bilateral shoulders, lower back and both knees    Chronic back pain     Depression     Diverticulosis     Febrile seizure (HCC)     History of nuclear stress test 11/27/2015    cardiolite-normal,EF70%    Hyperlipidemia     Borderline    Hypertension     Obesity     Osteoarthritis      Polyneuropathy     Restless legs     Shortness of breath on exertion     Sleep apnea     Uses CPAP    Wears glasses      Past Surgical History:   Procedure Laterality Date    APPENDECTOMY  1999    [place] During Cholecystectomy    CHOLECYSTECTOMY  1999    Appendectomy    COLONOSCOPY  2001    Found 2 Polyps    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    JOINT REPLACEMENT Left 02/2010    Total Left Knee    TUBAL LIGATION  1978     Family History   Problem Relation Age of Onset    Heart Disease Mother     High Blood Pressure Mother     Stroke Mother     Obesity Mother     Heart Disease Father     High Blood Pressure Father     Alcohol Abuse Father     Arrhythmia Sister  defibrillator    Breast Cancer Sister 67    Anemia Sister     Other Sister     Left Breast Cancer Sister     Heart Disease Sister     Anemia Sister     Arthritis Sister         psoriatic    Obesity Sister     Osteoporosis Sister     Psoriasis Sister     Cirrhosis Sister     Anemia Brother     Diabetes Brother     Heart Disease Brother         Bypass Surgery    Other Brother     Other Brother     Mental Illness Son         PTSD    Psoriasis Son      Social History     Socioeconomic History    Marital status: Divorced     Spouse name: None    Number of children: None    Years of education: None    Highest education level: None   Tobacco Use    Smoking status: Former     Current packs/day: 0.00     Average packs/day: 0.5 packs/day for 55.2 years (27.6 ttl pk-yrs)     Types: Cigarettes     Start date: 06/19/1963     Quit date: 08/16/2018     Years since quitting: 6.1    Smokeless tobacco: Never   Vaping Use    Vaping status: Never Used   Substance and Sexual Activity    Alcohol use: Not Currently     Comment: Occ. Maybe Once A Month    Drug use: Yes     Frequency: 7.0 times per week     Types: Marijuana (Weed)     Comment: smokes daily or gummies for pain    Sexual activity: Not Currently     Partners: Male     Social Drivers of Health      Financial Resource Strain: Low Risk  (04/24/2022)    Overall Financial Resource Strain (CARDIA)     Difficulty of Paying Living Expenses: Not very hard   Food Insecurity: No Food Insecurity (05/11/2024)    Hunger Vital Sign     Worried About Running Out of Food in the Last Year: Never true     Ran Out of Food in the Last Year: Never true   Transportation Needs: No Transportation Needs (05/11/2024)    PRAPARE - Therapist, Art (Medical): No     Lack of Transportation (Non-Medical): No   Physical Activity: Inactive (06/20/2024)    Exercise Vital Sign     Days of Exercise per Week: 0 days     Minutes of Exercise per Session: 10 min   Intimate Partner Violence: Unknown (04/21/2022)    Humiliation, Afraid, Rape, and Kick questionnaire     Fear of Current or Ex-Partner: No   Housing Stability: Low Risk  (05/11/2024)    Housing Stability Vital Sign     Unable to Pay for Housing in the Last Year: No     Number of Times Moved in the Last Year: 0     Homeless in the Last Year: No     Current Outpatient Medications   Medication Sig Dispense Refill    albuterol  sulfate HFA (PROVENTIL ;VENTOLIN ;PROAIR ) 108 (90 Base) MCG/ACT inhaler Inhale 2 puffs into the lungs every 4 hours as needed for Wheezing 18 g 1  tiZANidine  (ZANAFLEX ) 2 MG tablet Take 1 tablet by mouth every 8 hours as needed (muscle spasm) 270 tablet 1    meloxicam  (MOBIC ) 7.5 MG tablet Take 1 tablet by mouth daily 90 tablet 1    gabapentin  (NEURONTIN ) 300 MG capsule Take 3 capsules by mouth 3 times daily for 90 days. 270 capsule 2    benazepril  (LOTENSIN ) 20 MG tablet TAKE 1 TABLET BY MOUTH DAILY 90 tablet 1    allopurinol  (ZYLOPRIM ) 300 MG tablet Take 1 tablet by mouth daily 90 tablet 1    Riboflavin (VITAMIN B-2 PO) Take by mouth daily      DULoxetine  (CYMBALTA ) 30 MG extended release capsule Take 1 capsule by mouth daily 90 capsule 1    cetirizine  (ZYRTEC ) 10 MG tablet Take 1 tablet by mouth daily 90 tablet 1    azithromycin  (ZITHROMAX   Z-PAK) 250 MG tablet 2 tabs day 1, 1 tab day 2 to 5 (Patient not taking: Reported on 11/08/2023) 6 tablet 0    alendronate  (FOSAMAX ) 70 MG tablet Take 1 tablet by mouth every 7 days (Patient not taking: Reported on 12/16/2023) 13 tablet 1    turmeric (QC TUMERIC COMPLEX) 500 MG CAPS Take by mouth daily      Ascorbic Acid (VITAMIN C) 1000 MG tablet Take 1 tablet by mouth daily      Cholecalciferol (VITAMIN D3) 1.25 MG (50000 UT) CAPS Take 1 capsule by mouth daily       No current facility-administered medications for this visit.     No Known Allergies      Review of Systems   Constitutional:  Negative for chills and fever.   HENT:  Negative for congestion and sneezing.    Eyes:  Negative for pain and redness.   Respiratory:  Negative for chest tightness, shortness of breath and wheezing.    Cardiovascular:  Negative for chest pain and palpitations.   Gastrointestinal:  Negative for vomiting.   Musculoskeletal:  Positive for arthralgias.   Skin:  Negative for color change and rash.   Neurological:  Negative for weakness and numbness.   Psychiatric/Behavioral:  Negative for agitation. The patient is not nervous/anxious.                                                Examination:  General Exam:  Vitals: Pulse 77   Temp 98.9 F (37.2 C)   SpO2 99%    Physical Exam  Vitals and nursing note reviewed.   Constitutional:       Appearance: Normal appearance.   HENT:      Head: Normocephalic and atraumatic.   Eyes:      Conjunctiva/sclera: Conjunctivae normal.      Pupils: Pupils are equal, round, and reactive to light.   Pulmonary:      Effort: Pulmonary effort is normal.   Musculoskeletal:      Right shoulder: No swelling, deformity, laceration, tenderness or bony tenderness. Normal range of motion. Normal strength.      Right elbow: Normal.      Left elbow: Normal. No swelling, deformity, effusion or lacerations. Normal range of motion. No tenderness.      Cervical back: Normal range of motion.      Comments: Left Upper  Extremity:     There is moderate to severe tenderness diffusely throughout the shoulder.  Tenderness  to palpation is maximal over the anterior and lateral aspects of the shoulder specifically along the greater tuberosity and proximal biceps tendon.  Active range of motion is severely limited due to pain.  Forward elevation present to 80 degrees, abduction present to 30 degrees, external rotation 15 degrees.  Passive range of motion is equally limited and painful specifically at the extremes.  There is crepitation and grinding which is audible during rotational movements of the shoulder.  Rotator cuff testing is difficult due to pain.  Strength 2/5 forward elevation and abduction of the shoulder.  There is breakaway to strength testing due to pain.  Positive empty can test.  Positive drop arm sign.  Positive Hawkins and Neer signs.  Moderate pain at the acromioclavicular joint with crossarm test.     Skin is intact.  Sensation is intact in the upper extremity.  2+ radial pulse.  No edema.  No muscle atrophy.       Skin:     General: Skin is warm and dry.   Neurological:      Mental Status: She is alert and oriented to person, place, and time.   Psychiatric:         Mood and Affect: Mood normal.         Behavior: Behavior normal.            Diagnostic testing:  X-ray images were reviewed by myself and discussed with the patient:    X-ray images of the left shoulder show evidence of advanced severe glenohumeral joint degenerative disease with complete loss of joint space.  There is extensive subchondral sclerosis and irregularities of the articular surfaces on both sides of joint.  Prominent spurring present at the inferior aspect of the humeral head.  No evidence of fracture or acute abnormality.  Spurring present at the greater tuberosity and inferior aspect of the acromion.     Impression: Severe advanced glenohumeral degenerative joint disease       Office Procedures:  Orders Placed This Encounter   Procedures     DRAIN/INJECT LARGE JOINT/BURSA       Assessment and Plan  1.  Left shoulder advanced primary osteoarthritis    2.  Left shoulder rotator cuff insufficiency    The patient's history and physical exam are consistent with left shoulder rotator cuff strain and impingement syndrome.  I have explained to the patient that my suspicion is low for full-thickness tear of the rotator cuff at this time.  Therefore I recommended we focus on conservative treatments.    I have sent a referral for physical therapy to address rehabilitation and strengthening of the rotator cuff.    Due to the patient's current pain level I feel that they would benefit from a steroid injection to the right shoulder prior to participation in therapy.    Procedure Note, Left Shoulder Subacromial Injection:  Multiple treatment options were discussed.  Joint injection was recommended.  Details of the procedure, potential risks, and potential benefits were discussed.  Patient's questions were answered.  Patient elected to proceed with procedure.  The left shoulder was prepped with alcohol and then the subacromial space was injected with 40 mg of Kenalog  and 4 mL of 1% plain lidocaine using a 22-gauge needle.  Sterile Band-Aid was applied.  The patient tolerated it well with no complications.    I have instructed the patient on basic stretching exercises and we discussed activity modification and avoidance of strenuous activities at the shoulder.  We discussed the use of over-the-counter anti-inflammatory medications as needed.    I would like the patient to follow-up here in 6 weeks to check the results of the therapy and injection.  We will consider an MRI at the next visit if symptoms are unimproved.      Electronically signed by Calise Dunckel, MD on 10/23/2024 at 2:42 PM       "

## 2024-11-01 ENCOUNTER — Ambulatory Visit
Admit: 2024-11-01 | Discharge: 2024-11-01 | Payer: Medicare (Managed Care) | Primary: Student in an Organized Health Care Education/Training Program

## 2024-11-01 VITALS — BP 108/64 | HR 57 | Ht <= 58 in | Wt 231.4 lb

## 2024-11-01 DIAGNOSIS — M545 Low back pain, unspecified: Principal | ICD-10-CM

## 2024-11-01 MED ORDER — ALLOPURINOL 300 MG PO TABS
300 | ORAL_TABLET | Freq: Every day | ORAL | 1 refills | Status: AC
Start: 2024-11-01 — End: ?

## 2024-11-01 MED ORDER — GABAPENTIN 300 MG PO CAPS
300 | ORAL_CAPSULE | Freq: Three times a day (TID) | ORAL | 0 refills | Status: AC
Start: 2024-11-01 — End: 2025-01-30

## 2024-11-01 MED ORDER — DULOXETINE HCL 30 MG PO CPEP
30 | ORAL_CAPSULE | Freq: Every day | ORAL | 1 refills | Status: AC
Start: 2024-11-01 — End: ?

## 2024-11-01 NOTE — Progress Notes (Signed)
 [address]    Sue Barber  [phone]    [place]  [phone]  73 y.o.  1951/08/29    Chief Complaint:  Chief Complaint   Patient presents with    3 Month Follow-Up     3 month follow up for med refill.   No concerns today       HPI:  Sue Barber is a 73 y.o. female, patient of Regan, Vernell HERO, MD, who presents today for evaluation and management of lumbar pain, obesity, OSA, osteoporosis, HLD.    Gabapentin  900 helping lumbar pain. Prev saw pain management in [place] .     OSA on CPAP  Defers lung cancer screen    Osteoporosis - didn't like oral fosamax , will consider prolia. Takes calcium and vit D    HLD - myalgias w/ 2-3 different statins    History of Present Illness      Assessment and Medical Decision Making:  Assessment & Plan      1. Chronic bilateral low back pain without sciatica  -     gabapentin  (NEURONTIN ) 300 MG capsule; Take 3 capsules by mouth 3 times daily for 90 days., Disp-810 capsule, R-0Normal  2. Neuropathy  -     gabapentin  (NEURONTIN ) 300 MG capsule; Take 3 capsules by mouth 3 times daily for 90 days., Disp-810 capsule, R-0Normal  3. Gout, unspecified cause, unspecified chronicity, unspecified site  -     allopurinol  (ZYLOPRIM ) 300 MG tablet; Take 1 tablet by mouth daily, Disp-90 tablet, R-1Normal  4. MDD (major depressive disorder), recurrent episode, moderate (HCC)  -     DULoxetine  (CYMBALTA ) 30 MG extended release capsule; Take 1 capsule by mouth daily, Disp-90 capsule, R-1Normal  5. Primary hypertension  6. Influenza vaccine needed  -     Influenza, FLUAD Trivalent, (age 10 y+), IM, Preservative Free, 0.5mL       Patient Active Problem List   Diagnosis    OSA on CPAP    Vitamin D deficiency    Primary hypertension    Class 3 severe obesity due to excess calories with serious comorbidity and body mass index (BMI) of 45.0 to 49.9 in adult [facility])    Chronic bilateral low back pain without sciatica    Chronic left shoulder pain    Depression    MDD (major depressive disorder), recurrent  episode, moderate (HCC)    Pain in both lower legs    Swelling of lower leg    Gout    Neuropathy    Dependent on walker for ambulation    Osteoporosis    Osteoarthritis, localized, shoulder, left    Nontraumatic tear of left rotator cuff       Social History:  Social History     Socioeconomic History    Marital status: Divorced     Spouse name: Not on file    Number of children: Not on file    Years of education: Not on file    Highest education level: Not on file   Occupational History    Not on file   Tobacco Use    Smoking status: Former     Current packs/day: 0.00     Average packs/day: 0.5 packs/day for 55.2 years (27.6 ttl pk-yrs)     Types: Cigarettes     Start date: 06/19/1963     Quit date: 08/16/2018     Years since quitting: 6.2    Smokeless tobacco: Never   Vaping Use    Vaping status:  Never Used   Substance and Sexual Activity    Alcohol use: Not Currently     Comment: Occ. Maybe Once A Month    Drug use: Yes     Frequency: 7.0 times per week     Types: Marijuana (Weed)     Comment: smokes daily or gummies for pain    Sexual activity: Not Currently     Partners: Male   Other Topics Concern    Not on file   Social History Narrative    Not on file     Social Drivers of Health     Financial Resource Strain: Low Risk  (04/24/2022)    Overall Financial Resource Strain (CARDIA)     Difficulty of Paying Living Expenses: Not very hard   Food Insecurity: No Food Insecurity (05/11/2024)    Hunger Vital Sign     Worried About Running Out of Food in the Last Year: Never true     Ran Out of Food in the Last Year: Never true   Transportation Needs: No Transportation Needs (05/11/2024)    PRAPARE - Therapist, Art (Medical): No     Lack of Transportation (Non-Medical): No   Physical Activity: Inactive (06/20/2024)    Exercise Vital Sign     Days of Exercise per Week: 0 days     Minutes of Exercise per Session: 10 min   Stress: Not on file   Social Connections: Not on file   Intimate Partner  Violence: Unknown (04/21/2022)    Humiliation, Afraid, Rape, and Kick questionnaire     Fear of Current or Ex-Partner: No     Emotionally Abused: Not on file     Physically Abused: Not on file     Sexually Abused: Not on file   Housing Stability: Low Risk  (05/11/2024)    Housing Stability Vital Sign     Unable to Pay for Housing in the Last Year: No     Number of Times Moved in the Last Year: 0     Homeless in the Last Year: No       Current Outpatient Medications   Medication Sig Dispense Refill    gabapentin  (NEURONTIN ) 300 MG capsule Take 3 capsules by mouth 3 times daily for 90 days. 810 capsule 0    allopurinol  (ZYLOPRIM ) 300 MG tablet Take 1 tablet by mouth daily 90 tablet 1    DULoxetine  (CYMBALTA ) 30 MG extended release capsule Take 1 capsule by mouth daily 90 capsule 1    albuterol  sulfate HFA (PROVENTIL ;VENTOLIN ;PROAIR ) 108 (90 Base) MCG/ACT inhaler Inhale 2 puffs into the lungs every 4 hours as needed for Wheezing 18 g 1    tiZANidine  (ZANAFLEX ) 2 MG tablet Take 1 tablet by mouth every 8 hours as needed (muscle spasm) 270 tablet 1    meloxicam  (MOBIC ) 7.5 MG tablet Take 1 tablet by mouth daily 90 tablet 1    benazepril  (LOTENSIN ) 20 MG tablet TAKE 1 TABLET BY MOUTH DAILY 90 tablet 1    Riboflavin (VITAMIN B-2 PO) Take by mouth daily      turmeric (QC TUMERIC COMPLEX) 500 MG CAPS Take by mouth daily      Ascorbic Acid (VITAMIN C) 1000 MG tablet Take 1 tablet by mouth daily      Cholecalciferol (VITAMIN D3) 1.25 MG (50000 UT) CAPS Take 1 capsule by mouth daily      cetirizine  (ZYRTEC ) 10 MG tablet Take 1 tablet by mouth daily 90  tablet 1     No current facility-administered medications for this visit.       Allergies:  No Known Allergies    Review of Systems:  Review of Systems   Constitutional:  Negative for appetite change and fatigue.   Respiratory:  Negative for shortness of breath.    Cardiovascular:  Negative for chest pain.   Gastrointestinal:  Negative for abdominal pain.   Musculoskeletal:  Positive  for arthralgias and back pain.   Skin:  Negative for rash.   Psychiatric/Behavioral:  Negative for dysphoric mood. The patient is not nervous/anxious.        Physical Exam:  Vitals:    11/01/24 1126   BP: 108/64   Pulse: 57   SpO2: 94%     BP Readings from Last 3 Encounters:   11/01/24 108/64   06/15/24 (!) 116/54   05/11/24 92/62      Wt Readings from Last 3 Encounters:   11/01/24 105 kg (231 lb 6.4 oz)   06/23/24 103 kg (227 lb)   06/15/24 103.1 kg (227 lb 3.2 oz)       Physical Exam      Physical Exam  Vitals and nursing note reviewed.   Constitutional:       General: She is not in acute distress.     Appearance: Normal appearance. She is obese. She is not ill-appearing.   HENT:      Head: Normocephalic and atraumatic.   Cardiovascular:      Rate and Rhythm: Normal rate and regular rhythm.      Pulses: Normal pulses.      Heart sounds: Normal heart sounds. No murmur heard.  Pulmonary:      Effort: Pulmonary effort is normal.      Breath sounds: Normal breath sounds.   Abdominal:      General: Abdomen is flat. Bowel sounds are normal.      Palpations: Abdomen is soft.      Tenderness: There is no abdominal tenderness.   Musculoskeletal:         General: Normal range of motion.      Cervical back: Normal range of motion.   Skin:     General: Skin is warm and dry.      Findings: No erythema or rash.   Neurological:      General: No focal deficit present.      Mental Status: She is alert and oriented to person, place, and time. Mental status is at baseline.   Psychiatric:         Mood and Affect: Mood normal.         Thought Content: Thought content normal.         The patient indicates understanding of of issues discussed today and agrees with the plan  I reviewed the patient's past medical, family and and social history.  The patient was offered an After Visit Summary sheet that lists all of their medications with directions, allergies, orders placed during this encounter, immunization dates, and follow up  instructions.  The information is also available in My Chart.    Follow up:  Return for 3 mo neuropathy follow up Dr Clive.    Electronically Signed by Sue FORBES Spark, PA-C  "

## 2024-11-02 ENCOUNTER — Encounter

## 2024-11-02 MED ORDER — CETIRIZINE HCL 10 MG PO TABS
10 | ORAL_TABLET | Freq: Every day | ORAL | 1 refills | 30.00000 days | Status: DC
Start: 2024-11-02 — End: 2025-01-24

## 2025-01-01 ENCOUNTER — Encounter

## 2025-01-02 MED ORDER — ALBUTEROL SULFATE HFA 108 (90 BASE) MCG/ACT IN AERS
108 | RESPIRATORY_TRACT | 1 refills | 25.00000 days | Status: AC | PRN
Start: 2025-01-02 — End: ?

## 2025-01-24 ENCOUNTER — Encounter

## 2025-01-26 MED ORDER — BENAZEPRIL HCL 20 MG PO TABS
20 | ORAL_TABLET | Freq: Every day | ORAL | 1 refills | 90.00000 days | Status: AC
Start: 2025-01-26 — End: ?

## 2025-01-26 MED ORDER — CETIRIZINE HCL 10 MG PO TABS
10 | ORAL_TABLET | Freq: Every day | ORAL | 1 refills | 30.00000 days | Status: AC
Start: 2025-01-26 — End: ?

## 2025-01-26 MED ORDER — TIZANIDINE HCL 2 MG PO TABS
2 | ORAL_TABLET | Freq: Three times a day (TID) | ORAL | 1 refills | 30.00000 days | Status: AC | PRN
Start: 2025-01-26 — End: ?

## 2025-01-26 MED ORDER — MELOXICAM 7.5 MG PO TABS
7.5 | ORAL_TABLET | Freq: Every day | ORAL | 1 refills | 90.00000 days | Status: AC
Start: 2025-01-26 — End: ?

## 2025-02-05 ENCOUNTER — Encounter
Payer: Medicare (Managed Care) | Attending: Student in an Organized Health Care Education/Training Program | Primary: Student in an Organized Health Care Education/Training Program
